# Patient Record
Sex: Female | Born: 1995 | Hispanic: Yes | Marital: Single | Smoking: Never smoker
Health system: Southern US, Community
[De-identification: ages and names within clinical notes are randomized; demographics above are authoritative.]

## PROBLEM LIST (undated history)

## (undated) DIAGNOSIS — N159 Renal tubulo-interstitial disease, unspecified: Secondary | ICD-10-CM

## (undated) DIAGNOSIS — E079 Disorder of thyroid, unspecified: Secondary | ICD-10-CM

## (undated) DIAGNOSIS — T1490XA Injury, unspecified, initial encounter: Secondary | ICD-10-CM

## (undated) HISTORY — PX: OTHER SURGICAL HISTORY: SHX169

## (undated) HISTORY — DX: Disorder of thyroid, unspecified: E07.9

## (undated) HISTORY — DX: Injury, unspecified, initial encounter: T14.90XA

---
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
## 2016-01-21 ENCOUNTER — Emergency Department: Payer: Medicaid Other

## 2016-01-21 ENCOUNTER — Emergency Department
Admission: EM | Admit: 2016-01-21 | Discharge: 2016-01-21 | Disposition: A | Discharge status: Home / Self Care | Payer: Medicaid Other | Attending: Emergency Medicine | Admitting: Emergency Medicine

## 2016-01-21 ENCOUNTER — Encounter: Payer: Self-pay | Admitting: Emergency Medicine

## 2016-01-21 DIAGNOSIS — O2341 Unspecified infection of urinary tract in pregnancy, first trimester: Secondary | ICD-10-CM | POA: Insufficient documentation

## 2016-01-21 DIAGNOSIS — R1030 Lower abdominal pain, unspecified: Secondary | ICD-10-CM | POA: Diagnosis present

## 2016-01-21 DIAGNOSIS — Z3A01 Less than 8 weeks gestation of pregnancy: Secondary | ICD-10-CM | POA: Diagnosis not present

## 2016-01-21 DIAGNOSIS — N390 Urinary tract infection, site not specified: Secondary | ICD-10-CM

## 2016-01-21 DIAGNOSIS — Z3491 Encounter for supervision of normal pregnancy, unspecified, first trimester: Secondary | ICD-10-CM

## 2016-01-21 LAB — URINALYSIS COMPLETE WITH MICROSCOPIC (ARMC ONLY)
BILIRUBIN URINE: NEGATIVE
GLUCOSE, UA: NEGATIVE mg/dL
NITRITE: NEGATIVE
PH: 6 (ref 5.0–8.0)
Protein, ur: 30 mg/dL — AB
SPECIFIC GRAVITY, URINE: 1.018 (ref 1.005–1.030)

## 2016-01-21 LAB — POCT PREGNANCY, URINE: Preg Test, Ur: POSITIVE — AB

## 2016-01-21 LAB — COMPREHENSIVE METABOLIC PANEL
ALT: 9 U/L — ABNORMAL LOW (ref 14–54)
ANION GAP: 12 (ref 5–15)
AST: 14 U/L — ABNORMAL LOW (ref 15–41)
Albumin: 4.7 g/dL (ref 3.5–5.0)
Alkaline Phosphatase: 55 U/L (ref 38–126)
BILIRUBIN TOTAL: 1.1 mg/dL (ref 0.3–1.2)
BUN: 12 mg/dL (ref 6–20)
CHLORIDE: 101 mmol/L (ref 101–111)
CO2: 20 mmol/L — ABNORMAL LOW (ref 22–32)
Calcium: 9.3 mg/dL (ref 8.9–10.3)
Creatinine, Ser: 0.7 mg/dL (ref 0.44–1.00)
Glucose, Bld: 98 mg/dL (ref 65–99)
POTASSIUM: 3.7 mmol/L (ref 3.5–5.1)
Sodium: 133 mmol/L — ABNORMAL LOW (ref 135–145)
TOTAL PROTEIN: 8.1 g/dL (ref 6.5–8.1)

## 2016-01-21 LAB — CBC
HEMATOCRIT: 40.7 % (ref 35.0–47.0)
Hemoglobin: 14.1 g/dL (ref 12.0–16.0)
MCH: 30 pg (ref 26.0–34.0)
MCHC: 34.7 g/dL (ref 32.0–36.0)
MCV: 86.6 fL (ref 80.0–100.0)
Platelets: 218 10*3/uL (ref 150–440)
RBC: 4.7 MIL/uL (ref 3.80–5.20)
RDW: 13.6 % (ref 11.5–14.5)
WBC: 16.9 10*3/uL — AB (ref 3.6–11.0)

## 2016-01-21 LAB — HCG, QUANTITATIVE, PREGNANCY: hCG, Beta Chain, Quant, S: 1781 m[IU]/mL — ABNORMAL HIGH (ref <5)

## 2016-01-21 LAB — LIPASE, BLOOD: LIPASE: 15 U/L (ref 11–51)

## 2016-01-21 MED ORDER — ACETAMINOPHEN 500 MG PO TABS
1000.0000 mg | ORAL_TABLET | ORAL | Status: AC
  Administered 2016-01-21: 1000 mg via ORAL
  Filled 2016-01-21: qty 2

## 2016-01-21 MED ORDER — PROMETHAZINE HCL 25 MG PO TABS
12.5000 mg | ORAL_TABLET | Freq: Once | ORAL | Status: AC
  Administered 2016-01-21: 12.5 mg via ORAL

## 2016-01-21 MED ORDER — PROMETHAZINE HCL 25 MG PO TABS
ORAL_TABLET | ORAL | Status: AC
  Administered 2016-01-21: 12.5 mg via ORAL
  Filled 2016-01-21: qty 1

## 2016-01-21 MED ORDER — DEXTROSE 5 % IV SOLN
1.0000 g | Freq: Once | INTRAVENOUS | Status: AC
  Administered 2016-01-21: 1 g via INTRAVENOUS
  Filled 2016-01-21: qty 10

## 2016-01-21 MED ORDER — SODIUM CHLORIDE 0.9 % IV BOLUS (SEPSIS)
1000.0000 mL | Freq: Once | INTRAVENOUS | Status: AC
  Administered 2016-01-21: 1000 mL via INTRAVENOUS

## 2016-01-21 MED ORDER — CEPHALEXIN 500 MG PO CAPS: 500.0000 mg | ORAL_CAPSULE | Freq: Four times a day (QID) | ORAL | 0 refills | Status: DC

## 2016-01-21 NOTE — ED Provider Notes (Signed)
[facility] Emergency Department Provider Note   ____________________________________________   First MD Initiated Contact with Patient 01/21/16 1920     (approximate)  I have reviewed the triage vital signs and the nursing notes.   HISTORY  Chief Complaint Abdominal Pain and Back Pain    HPI Erin Valentine is a 20 y.o. female reports a previous history of urinary tract infection.  She reports she had a UTI about 5 months ago. Now she suspects she may be pregnant and be having urinary tract infection for about the last week. Last menstrual period was around August 5, though not quite certain.  The last week she has experienced a slight burning feeling with urination and mild discomfort over the left lower back and lower abdomen. She reports she thinks she may have a "UTI. She's been nauseated in the mornings, which led her to check a pregnancy test yesterday which was positive. She denies any vaginal bleeding or discharge. No vomiting, mild nausea at times but none now.  Reports she has never been previously pregnant.  Patient does tell me that the end of last year she had a severe episode of a urinary tract infection, reports at that time symptoms seem much worse than today.  Past Medical History:  Diagnosis Date  . Kidney infection     Patient Active Problem List   Diagnosis Date Noted  . Severe sepsis (HCC) 03/08/2015  . Pyelonephritis 03/06/2015    History reviewed. No pertinent surgical history.  Prior to Admission medications   Medication Sig Start Date End Date Taking? Authorizing Provider  cephALEXin (KEFLEX) 500 MG capsule Take 1 capsule (500 mg total) by mouth 4 (four) times daily. 01/21/16   Sharyn Creamer, MD  Chlorphen-Pseudoephed-APAP [facility] FLU/COLD PO) Take 1 Package by mouth as needed.    Historical Provider, MD  clotrimazole (LOTRIMIN) 1 % cream Apply 1 application topically 2 (two) times daily. Apply to affected skin  01/07/15   Roxy Horseman, PA-C  HYDROcodone-acetaminophen (NORCO) 5-325 MG tablet Take 1 tablet by mouth every 6 (six) hours as needed for moderate pain. 06/06/15   Irean Hong, MD  ibuprofen (ADVIL,MOTRIN) 600 MG tablet Take 1 tablet (600 mg total) by mouth every 8 (eight) hours as needed. 06/06/15   Irean Hong, MD  levofloxacin (LEVAQUIN) 750 MG tablet Take 1 tablet (750 mg total) by mouth daily. 03/10/15   Gwynn Burly, DO  ondansetron (ZOFRAN ODT) 4 MG disintegrating tablet Take 1 tablet (4 mg total) by mouth every 8 (eight) hours as needed for nausea or vomiting. 06/06/15   Irean Hong, MD  phenazopyridine (PYRIDIUM) 100 MG tablet Take 1 tablet (100 mg total) by mouth 3 (three) times daily as needed for pain. 10/08/15   Jeanmarie Plant, MD  sulfamethoxazole-trimethoprim (BACTRIM DS,SEPTRA DS) 800-160 MG tablet Take 2 tablets by mouth 2 (two) times daily. 06/06/15   Irean Hong, MD    Allergies Review of patient's allergies indicates no known allergies.  History reviewed. No pertinent family history.  Social History Social History  Substance Use Topics  . Smoking status: Never Smoker  . Smokeless tobacco: Not on file  . Alcohol use No    Review of Systems Constitutional: No feverFeeling chilled at times. Does not feel weak or dehydrated Eyes: No visual changes. ENT: No sore throat. Cardiovascular: Denies chest pain. Respiratory: Denies shortness of breath. Gastrointestinal: Mild discomfort in the left lower back and over the "bladder". No nausea, no vomiting.  No diarrhea.  No constipation. Genitourinary: Negative for dysuria. Musculoskeletal: Negative for back pain. Skin: Negative for rash. Neurological: Negative for headaches, focal weakness or numbness.  Should be noted that the patient speaks [place], [place], our Spanish interpreter was utilized throughout history taking and examination.  10-point ROS otherwise  negative.  ____________________________________________   PHYSICAL EXAM:  VITAL SIGNS: ED Triage Vitals  Enc Vitals Group     BP 01/21/16 1705 102/67     Pulse Rate 01/21/16 1705 (!) 55     Resp 01/21/16 1705 16     Temp 01/21/16 1705 98.6 F (37 C)     Temp Source 01/21/16 1705 Oral     SpO2 01/21/16 1705 100 %     Weight 01/21/16 1704 130 lb (59 kg)     Height --      Head Circumference --      Peak Flow --      Pain Score 01/21/16 1704 8     Pain Loc --      Pain Edu? --      Excl. in GC? --     Constitutional: Alert and oriented. Well appearing and in no acute distress. Eyes: Conjunctivae are normal. PERRL. EOMI. Head: Atraumatic. Nose: No congestion/rhinnorhea. Mouth/Throat: Mucous membranes are moist.  Oropharynx non-erythematous. Neck: No stridor.   Cardiovascular: Normal rate, regular rhythm. Grossly normal heart sounds.  Good peripheral circulation. Respiratory: Normal respiratory effort.  No retractions. Lungs CTAB. Gastrointestinal: Soft and nontender. No distention. Mild suprapubic discomfort. No abdominal bruits. No CVA tenderness. No obvious gravid abdomen. Musculoskeletal: No lower extremity tenderness nor edema.  Neurologic:  Normal speech and language. No gross focal neurologic deficits are appreciated. Skin:  Skin is warm, dry and intact. No rash noted. Psychiatric: Mood and affect are normal. Speech and behavior are normal.  ____________________________________________   LABS (all labs ordered are listed, but only abnormal results are displayed)  Labs Reviewed  COMPREHENSIVE METABOLIC PANEL - Abnormal; Notable for the following:       Result Value   Sodium 133 (*)    CO2 20 (*)    AST 14 (*)    ALT 9 (*)    All other components within normal limits  CBC - Abnormal; Notable for the following:    WBC 16.9 (*)    All other components within normal limits  URINALYSIS COMPLETEWITH MICROSCOPIC ([facility] ONLY) - Abnormal; Notable for the following:     Color, Urine YELLOW (*)    APPearance CLEAR (*)    Ketones, ur TRACE (*)    Hgb urine dipstick 2+ (*)    Protein, ur 30 (*)    Leukocytes, UA 1+ (*)    Bacteria, UA RARE (*)    Squamous Epithelial / LPF 0-5 (*)    All other components within normal limits  HCG, QUANTITATIVE, PREGNANCY - Abnormal; Notable for the following:    hCG, Beta Chain, Quant, S 1,781 (*)    All other components within normal limits  POCT PREGNANCY, URINE - Abnormal; Notable for the following:    Preg Test, Ur POSITIVE (*)    All other components within normal limits  URINE CULTURE  LIPASE, BLOOD  POC URINE PREG, ED   ____________________________________________  EKG   ____________________________________________  RADIOLOGY  [place] Ob Comp Less 14 Wks  Result Date: 01/21/2016 CLINICAL DATA:  Pregnant patient in first-trimester pregnancy with bilateral flank pain for 6 hours. Lower abdominal pain onset yesterday. Beta HCG 1,781. EXAM: OBSTETRIC <14 WK  [place] AND TRANSVAGINAL OB US TECHNIQUE: Both transabdominal and transvaginal ultrasound examinations were performed for complete evaluation of the gestation as well as the maternal uterus, adnexal regions, and pelvic cul-de-sac. Transvaginal technique was performed to assess early pregnancy. COMPARISON:  None. FINDINGS: Intrauterine gestational sac: Single Yolk sac:  Not visualized. Embryo:  Not visualized. Cardiac Activity: Not visualized. MSD: 3  mm   5 w   0  d Subchorionic hemorrhage: Small, crescentic in morphology measuring 2.2 x 0.2 x 0.8 cm. Maternal uterus/adnexae: The right ovary is normal in size and contains a 1.8 cm corpus luteal cyst. The left ovary is normal in size, there is a 1.6 cm para ovarian cyst. Small volume simple free fluid in the cul-de-sac. IMPRESSION: Probable early intrauterine gestational sac, but no yolk sac, fetal pole, or cardiac activity yet visualized. Recommend follow-up quantitative B-HCG levels and follow-up US in 14 days to confirm and  assess viability. This recommendation follows SRU consensus guidelines: Diagnostic Criteria for Nonviable Pregnancy Early in the First Trimester. Malva Limes Med 2013; 098:1191-47. Electronically Signed   By: Rubye Oaks M.D.   On: 01/21/2016 22:31   US Ob Transvaginal  Result Date: 01/21/2016 CLINICAL DATA:  Pregnant patient in first-trimester pregnancy with bilateral flank pain for 6 hours. Lower abdominal pain onset yesterday. Beta HCG 1,781. EXAM: OBSTETRIC <14 WK [place] AND TRANSVAGINAL OB US TECHNIQUE: Both transabdominal and transvaginal ultrasound examinations were performed for complete evaluation of the gestation as well as the maternal uterus, adnexal regions, and pelvic cul-de-sac. Transvaginal technique was performed to assess early pregnancy. COMPARISON:  None. FINDINGS: Intrauterine gestational sac: Single Yolk sac:  Not visualized. Embryo:  Not visualized. Cardiac Activity: Not visualized. MSD: 3  mm   5 w   0  d Subchorionic hemorrhage: Small, crescentic in morphology measuring 2.2 x 0.2 x 0.8 cm. Maternal uterus/adnexae: The right ovary is normal in size and contains a 1.8 cm corpus luteal cyst. The left ovary is normal in size, there is a 1.6 cm para ovarian cyst. Small volume simple free fluid in the cul-de-sac. IMPRESSION: Probable early intrauterine gestational sac, but no yolk sac, fetal pole, or cardiac activity yet visualized. Recommend follow-up quantitative B-HCG levels and follow-up US in 14 days to confirm and assess viability. This recommendation follows SRU consensus guidelines: Diagnostic Criteria for Nonviable Pregnancy Early in the First Trimester. Malva Limes Med 2013; 829:5621-30. Electronically Signed   By: Rubye Oaks M.D.   On: 01/21/2016 22:31   US Renal  Result Date: 01/21/2016 CLINICAL DATA:  Initial evaluation for acute bilateral flank pain for 6 hours. EXAM: RENAL / URINARY TRACT ULTRASOUND COMPLETE COMPARISON:  Prior CT from 06/06/2015. FINDINGS: Right Kidney:  Length: 10.4 cm. Echogenicity within normal limits. No mass or hydronephrosis visualized. Left Kidney: Length: 11.4 cm. Echogenicity within normal limits. No mass or hydronephrosis visualized. Bladder: Appears normal for degree of bladder distention. IMPRESSION: Normal renal ultrasound.  No acute abnormality identified. Electronically Signed   By: Rise Mu M.D.   On: 01/21/2016 21:31    ____________________________________________   PROCEDURES  Procedure(s) performed: None  Procedures  Critical Care performed: No  ____________________________________________   INITIAL IMPRESSION / ASSESSMENT AND PLAN / ED COURSE  Pertinent labs & imaging results that were available during my care of the patient were reviewed by me and considered in my medical decision making (see chart for details).  Patient presents today these may be pregnant, also suspecting a possible UTI. Clinical history of dysuria  associated with mild suprapubic tenderness as well as report of mild left flank pain appears consistent with probable urinary tract infection, and also positive pregnancy test. Given she does report abdominal pain has never had ultrasound confirming an intrauterine pregnancy we will proceed with evaluation to exclude ectopic pregnancy as well, though clinically I suspect most likely suffering from urinary tract infection.  ----------------------------------------- 10:45 PM on 01/21/2016 -----------------------------------------  No findings to support an ectopic pregnancy. Patient is very early, was relatively low hCG and I suspect the patient is likely suffering from urinary tract infection. No signs or symptoms of sepsis. Mild leukocytosis, may be secondary to pregnancy though most likely due to infection. Discussed with the patient, and she is resting comfortably and feeling well at this time. She will be following up closely, and I have provided her with careful and strict return  precautions. Plan will be to follow-up with Dr. Ranae Plumber, on the inside obstetrician. Patient will be calling Monday to set up. This position, discharge instructions, an antibiotic provided and discussed with the patient via interpreter.  Clinical Course     ____________________________________________   FINAL CLINICAL IMPRESSION(S) / ED DIAGNOSES  Final diagnoses:  UTI (lower urinary tract infection)  First trimester pregnancy      NEW MEDICATIONS STARTED DURING THIS VISIT:  New Prescriptions   CEPHALEXIN (KEFLEX) 500 MG CAPSULE    Take 1 capsule (500 mg total) by mouth 4 (four) times daily.     Note:  This document was prepared using Dragon voice recognition software and may include unintentional dictation errors.     Sharyn Creamer, MD 01/21/16 [phone]

## 2016-01-21 NOTE — ED Notes (Signed)
Pt returned from US

## 2016-01-21 NOTE — ED Triage Notes (Addendum)
Pt c/o lower back pain and mid abdominal pain. Not sure if pregnant. Has also been nauseated. No vaginal bleeding

## 2016-01-21 NOTE — Discharge Instructions (Signed)
Please follow up closely for repeat blood testing and an ultrasound of your baby within 2 weeks with obstetrics and gynecology or your primary doctor.  Return to the emergency room if you bleeding, pain worsens, you become weak and dizzy or lightheaded, you have vomiting or weakness, or fever, you have an episode of passing out, develop severe bleeding such as more than 1 soaked pad per hour for more than 3 straight hours, develop abdominal or pelvic pain, fevers chills or other new concerns arise.

## 2016-01-24 LAB — URINE CULTURE: Culture: >=100000 — AB

## 2016-03-02 ENCOUNTER — Emergency Department (HOSPITAL_COMMUNITY)
Admission: EM | Admit: 2016-03-02 | Discharge: 2016-03-02 | Disposition: A | Discharge status: Home / Self Care | Payer: Medicaid Other | Attending: Emergency Medicine | Admitting: Emergency Medicine

## 2016-03-02 ENCOUNTER — Emergency Department (HOSPITAL_COMMUNITY): Payer: Medicaid Other

## 2016-03-02 ENCOUNTER — Encounter (HOSPITAL_COMMUNITY): Payer: Self-pay

## 2016-03-02 DIAGNOSIS — O26899 Other specified pregnancy related conditions, unspecified trimester: Secondary | ICD-10-CM

## 2016-03-02 DIAGNOSIS — Z3A10 10 weeks gestation of pregnancy: Secondary | ICD-10-CM | POA: Insufficient documentation

## 2016-03-02 DIAGNOSIS — O2341 Unspecified infection of urinary tract in pregnancy, first trimester: Secondary | ICD-10-CM | POA: Diagnosis not present

## 2016-03-02 DIAGNOSIS — R102 Pelvic and perineal pain: Secondary | ICD-10-CM

## 2016-03-02 DIAGNOSIS — O2340 Unspecified infection of urinary tract in pregnancy, unspecified trimester: Secondary | ICD-10-CM

## 2016-03-02 DIAGNOSIS — O0281 Inappropriate change in quantitative human chorionic gonadotropin (hCG) in early pregnancy: Secondary | ICD-10-CM | POA: Diagnosis not present

## 2016-03-02 DIAGNOSIS — O26891 Other specified pregnancy related conditions, first trimester: Secondary | ICD-10-CM | POA: Diagnosis present

## 2016-03-02 LAB — WET PREP, GENITAL
CLUE CELLS WET PREP: NONE SEEN
SPERM: NONE SEEN
TRICH WET PREP: NONE SEEN
Yeast Wet Prep HPF POC: NONE SEEN

## 2016-03-02 LAB — URINE MICROSCOPIC-ADD ON

## 2016-03-02 LAB — URINALYSIS, ROUTINE W REFLEX MICROSCOPIC
Bilirubin Urine: NEGATIVE
Glucose, UA: NEGATIVE mg/dL
Hgb urine dipstick: NEGATIVE
Ketones, ur: NEGATIVE mg/dL
NITRITE: NEGATIVE
Protein, ur: NEGATIVE mg/dL
SPECIFIC GRAVITY, URINE: 1.019 (ref 1.005–1.030)
pH: 6.5 (ref 5.0–8.0)

## 2016-03-02 LAB — RAPID HIV SCREEN (HIV 1/2 AB+AG)
HIV 1/2 Antibodies: NONREACTIVE
HIV-1 P24 Antigen - HIV24: NONREACTIVE

## 2016-03-02 LAB — HCG, QUANTITATIVE, PREGNANCY: hCG, Beta Chain, Quant, S: 125404 m[IU]/mL — ABNORMAL HIGH (ref <5)

## 2016-03-02 MED ORDER — ONDANSETRON 4 MG PO TBDP
4.0000 mg | ORAL_TABLET | Freq: Once | ORAL | Status: AC
Start: 2016-03-02 — End: 2016-03-02
  Administered 2016-03-02: 4 mg via ORAL
  Filled 2016-03-02: qty 1

## 2016-03-02 MED ORDER — DEXTROSE 5 % IV SOLN: 1.0000 g | Freq: Once | INTRAVENOUS | Status: DC

## 2016-03-02 MED ORDER — CEFTRIAXONE SODIUM 250 MG IJ SOLR
250.0000 mg | Freq: Once | INTRAMUSCULAR | Status: AC
Start: 2016-03-02 — End: 2016-03-02
  Administered 2016-03-02: 250 mg via INTRAMUSCULAR
  Filled 2016-03-02: qty 250

## 2016-03-02 MED ORDER — DOXYLAMINE-PYRIDOXINE 10-10 MG PO TBEC: 1.0000 | DELAYED_RELEASE_TABLET | Freq: Three times a day (TID) | ORAL | 0 refills | Status: DC | PRN

## 2016-03-02 MED ORDER — AZITHROMYCIN 250 MG PO TABS
1000.0000 mg | ORAL_TABLET | Freq: Once | ORAL | Status: AC
Start: 2016-03-02 — End: 2016-03-02
  Administered 2016-03-02: 1000 mg via ORAL
  Filled 2016-03-02: qty 4

## 2016-03-02 MED ORDER — CEPHALEXIN 500 MG PO CAPS: 500.0000 mg | ORAL_CAPSULE | Freq: Three times a day (TID) | ORAL | 0 refills | Status: DC

## 2016-03-02 NOTE — ED Notes (Signed)
Patient Alert and oriented X4. Stable and ambulatory. Patient verbalized understanding of the discharge instructions.  Patient belongings were taken by the patient.  

## 2016-03-02 NOTE — ED Provider Notes (Signed)
[facility]-EMERGENCY DEPT Provider Note   CSN: [id number] Arrival date & time: 03/02/16  7829     History   Chief Complaint Chief Complaint  Patient presents with  . Back Pain    HPI Erin Valentine is a 20 y.o. female.  HPI    20 year old female G1P0 Presents complaining of low back pain. Patient reports yesterday she developed some pain to her low abdomen radiates towards her back. Pain and now reside primary to the right low back. She described pain as a stabbing sensation, waxing waning, with burning urination and urinary frequency. She took some Tylenol which has helped. Pain is mild to moderate at this time. She did report having some nausea and occasional nonbloody non-bilious vomit yesterday and felt nauseous currently. She denies fever, chills, lightheadedness, dizziness, vaginal itching, vaginal discharge, vaginal bleeding, or rash. She has not had a formal ultrasound and have not follow-up with OB/GYN yet. She does have an appointment today but missed it. She is currently on prenatal vitamin. No history of kidney stone.    Past Medical History:  Diagnosis Date  . Kidney infection     Patient Active Problem List   Diagnosis Date Noted  . Severe sepsis (HCC) 03/08/2015  . Pyelonephritis 03/06/2015    History reviewed. No pertinent surgical history.  OB History    Gravida Para Term Preterm AB Living   1             SAB TAB Ectopic Multiple Live Births                   Home Medications    Prior to Admission medications   Medication Sig Start Date End Date Taking? Authorizing Provider  cephALEXin (KEFLEX) 500 MG capsule Take 1 capsule (500 mg total) by mouth 4 (four) times daily. 01/21/16   Sharyn Creamer, MD  Chlorphen-Pseudoephed-APAP [facility] FLU/COLD PO) Take 1 Package by mouth as needed.    Historical Provider, MD  clotrimazole (LOTRIMIN) 1 % cream Apply 1 application topically 2 (two) times daily. Apply to affected skin 01/07/15   Roxy Horseman, PA-C    HYDROcodone-acetaminophen (NORCO) 5-325 MG tablet Take 1 tablet by mouth every 6 (six) hours as needed for moderate pain. 06/06/15   Irean Hong, MD  ibuprofen (ADVIL,MOTRIN) 600 MG tablet Take 1 tablet (600 mg total) by mouth every 8 (eight) hours as needed. 06/06/15   Irean Hong, MD  levofloxacin (LEVAQUIN) 750 MG tablet Take 1 tablet (750 mg total) by mouth daily. 03/10/15   Gwynn Burly, DO  ondansetron (ZOFRAN ODT) 4 MG disintegrating tablet Take 1 tablet (4 mg total) by mouth every 8 (eight) hours as needed for nausea or vomiting. 06/06/15   Irean Hong, MD  phenazopyridine (PYRIDIUM) 100 MG tablet Take 1 tablet (100 mg total) by mouth 3 (three) times daily as needed for pain. 10/08/15   Jeanmarie Plant, MD  sulfamethoxazole-trimethoprim (BACTRIM DS,SEPTRA DS) 800-160 MG tablet Take 2 tablets by mouth 2 (two) times daily. 06/06/15   Irean Hong, MD    Family History History reviewed. No pertinent family history.  Social History Social History  Substance Use Topics  . Smoking status: Never Smoker  . Smokeless tobacco: Never Used  . Alcohol use No     Allergies   Review of patient's allergies indicates no known allergies.   Review of Systems Review of Systems  All other systems reviewed and are negative.    Physical Exam Updated  Vital Signs BP 105/70 (BP Location: Left Arm)   Pulse 70   Temp 97.7 F (36.5 C) (Oral)   Resp 16   Ht 5\' 5"  (1.651 m)   Wt 59 kg   LMP 12/19/2015 (Approximate)   SpO2 94%   BMI 21.63 kg/m   Physical Exam  Constitutional: She appears well-developed and well-nourished. No distress.  HENT:  Head: Atraumatic.  Eyes: Conjunctivae are normal.  Neck: Neck supple.  Cardiovascular: Normal rate and regular rhythm.   Pulmonary/Chest: Effort normal and breath sounds normal.  Abdominal: Soft. She exhibits no distension. There is tenderness (Mild suprapubic tenderness on palpation without guarding or rebound tenderness.).  Genitourinary:   Genitourinary Comments: Chaperone present during exam. No inguinal lymphadenopathy or inguinal hernia noted. Normal external genitalia. No pain with speculum insertion. Moderate yellow vaginal discharge noted in vaginal vault. Visualized close cervical os. On bimanual examination no adnexal tenderness or cervical motion tenderness.  Musculoskeletal: She exhibits tenderness (Mild tenderness noted to right lumbar paraspinal muscle on palpation without any significant midline spine tenderness.).  Neurological: She is alert.  Skin: No rash noted.  Psychiatric: She has a normal mood and affect.  Nursing note and vitals reviewed.    ED Treatments / Results  Labs (all labs ordered are listed, but only abnormal results are displayed) Labs Reviewed  WET PREP, GENITAL - Abnormal; Notable for the following:       Result Value   WBC, Wet Prep HPF POC MANY (*)    All other components within normal limits  URINALYSIS, ROUTINE W REFLEX MICROSCOPIC (NOT AT [facility]) - Abnormal; Notable for the following:    APPearance CLOUDY (*)    Leukocytes, UA LARGE (*)    All other components within normal limits  URINE MICROSCOPIC-ADD ON - Abnormal; Notable for the following:    Squamous Epithelial / LPF 6-30 (*)    Bacteria, UA MANY (*)    All other components within normal limits  HCG, QUANTITATIVE, PREGNANCY - Abnormal; Notable for the following:    hCG, Beta Chain, Quant, S 125,404 (*)    All other components within normal limits  URINE CULTURE  RAPID HIV SCREEN (HIV 1/2 AB+AG)  RPR  GC/CHLAMYDIA PROBE AMP ([place]) NOT AT [facility]    EKG  EKG Interpretation None       Radiology US Ob Comp Less 14 Wks  Result Date: 03/02/2016 CLINICAL DATA:  Right-sided kidney pain. EXAM: OBSTETRIC <14 WK [place] AND TRANSVAGINAL OB US TECHNIQUE: Both transabdominal and transvaginal ultrasound examinations were performed for complete evaluation of the gestation as well as the maternal uterus, adnexal regions, and  pelvic cul-de-sac. Transvaginal technique was performed to assess early pregnancy. COMPARISON:  01/21/2016. FINDINGS: Intrauterine gestational sac: Single Yolk sac:  Present Embryo:  Present Cardiac Activity: Present Heart Rate: 147  bpm CRL:  2.95 cm 10 w   6 d                  [place] EDC: 09/22/2016 Subchorionic hemorrhage:  Small. Maternal uterus/adnexae: 1.7 cm right hypoechoic region possibly residual corpus luteal cyst. Trace amount of free pelvic fluid . IMPRESSION: Single viable intrauterine pregnancy at 10 weeks 6 days. Small subchorionic hemorrhage noted. Trace free pelvic fluid. Follow-up exam can be obtained to demonstrate resolution of the subchorionic hemorrhage. Electronically Signed   By: Maisie Fus  Register   On: 03/02/2016 15:00   US Ob Transvaginal  Result Date: 03/02/2016 CLINICAL DATA:  Right-sided kidney pain. EXAM: OBSTETRIC <14 WK [place]  AND TRANSVAGINAL OB US TECHNIQUE: Both transabdominal and transvaginal ultrasound examinations were performed for complete evaluation of the gestation as well as the maternal uterus, adnexal regions, and pelvic cul-de-sac. Transvaginal technique was performed to assess early pregnancy. COMPARISON:  01/21/2016. FINDINGS: Intrauterine gestational sac: Single Yolk sac:  Present Embryo:  Present Cardiac Activity: Present Heart Rate: 147  bpm CRL:  2.95 cm 10 w   6 d                  US EDC: 09/22/2016 Subchorionic hemorrhage:  Small. Maternal uterus/adnexae: 1.7 cm right hypoechoic region possibly residual corpus luteal cyst. Trace amount of free pelvic fluid . IMPRESSION: Single viable intrauterine pregnancy at 10 weeks 6 days. Small subchorionic hemorrhage noted. Trace free pelvic fluid. Follow-up exam can be obtained to demonstrate resolution of the subchorionic hemorrhage. Electronically Signed   By: Maisie Fushomas  Register   On: 03/02/2016 15:00    Procedures Procedures (including critical care time)  Medications Ordered in ED Medications  azithromycin  (ZITHROMAX) tablet 1,000 mg (1,000 mg Oral Given 03/02/16 1202)  cefTRIAXone (ROCEPHIN) injection 250 mg (250 mg Intramuscular Given 03/02/16 1202)  ondansetron (ZOFRAN-ODT) disintegrating tablet 4 mg (4 mg Oral Given 03/02/16 1448)     Initial Impression / Assessment and Plan / ED Course  I have reviewed the triage vital signs and the nursing notes.  Pertinent labs & imaging results that were available during my care of the patient were reviewed by me and considered in my medical decision making (see chart for details).  Clinical Course    BP 97/56 (BP Location: Left Arm)   Pulse 69   Temp 97.7 F (36.5 C) (Oral)   Resp 18   Ht 5\' 5"  (1.651 m)   Wt 59 kg   LMP 12/19/2015 (Approximate)   SpO2 100%   BMI 21.63 kg/m    Final Clinical Impressions(s) / ED Diagnoses   Final diagnoses:  Urinary tract infection in mother during pregnancy, antepartum    New Prescriptions New Prescriptions   CEPHALEXIN (KEFLEX) 500 MG CAPSULE    Take 1 capsule (500 mg total) by mouth 3 (three) times daily.   DOXYLAMINE-PYRIDOXINE 10-10 MG TBEC    Take 1 tablet by mouth every 8 (eight) hours as needed (nausea).   9:57 AM Patient here with low abdominal pain and low back pain with urinary discomfort. Symptoms suggestive of urinary tract infection. She is currently [redacted] weeks pregnant, last menstrual period 08/13. She has not had a formal ultrasound to confirm IUP.  3:12 PM I performed bedside US which shows IUP.  I did obtain a formal US and it confirmed single living IUP 10 weeks and 6 days.  Pt does have evidence of UTI, will treat with keflex.  Rocephin/zithromax given in the ER to cover for potential STD.  Pt was found to be hypotensive in the ER but asymptomatic. This may be her baseline due to her small body habitus.  Doubt sepsis. She is well appearing, family member is at bedside. After discussion, Pt to f/u with OB for further care.  Strict return precaution discussed including fever, worsening  pain, lightheadedness/dizziness.     Fayrene HelperBowie Anmarie Fukushima, PA-C 03/02/16 1521    Alvira MondayErin Schlossman, MD 03/02/16 2350

## 2016-03-02 NOTE — ED Triage Notes (Signed)
Pt reports she has back pain. She has hx of kidney infection. Pt also reports she is [redacted] weeks pregnant.

## 2016-03-02 NOTE — ED Notes (Signed)
Patient transported to Ultrasound 

## 2016-03-02 NOTE — ED Notes (Signed)
Called US..will be a while before coming to get patient.

## 2016-03-02 NOTE — ED Notes (Signed)
Patient having soft blood pressure. Pt calm, comfortable, A/O x 4.  EDP notified.

## 2016-03-02 NOTE — Discharge Instructions (Signed)
You are 10 weeks and 6 days pregnant.  Followup with OBGYN for further management of your pregnancy.  You have a urinary tract infection.  Take antibiotic as prescribed for the full duration.

## 2016-03-02 NOTE — ED Notes (Signed)
Pt states that she threw up in trash can.  Unable to assess.  Notified EDP of Nausea/Vomiting.

## 2016-03-03 LAB — GC/CHLAMYDIA PROBE AMP (~~LOC~~) NOT AT ARMC
Chlamydia: NEGATIVE
Neisseria Gonorrhea: NEGATIVE

## 2016-03-03 LAB — RPR: RPR: NONREACTIVE

## 2016-03-04 LAB — URINE CULTURE: Culture: >=100000 — AB

## 2016-03-05 ENCOUNTER — Telehealth (HOSPITAL_BASED_OUTPATIENT_CLINIC_OR_DEPARTMENT_OTHER): Payer: Self-pay | Admitting: Emergency Medicine

## 2016-03-05 NOTE — Telephone Encounter (Signed)
Post ED Visit - Positive Culture Follow-up  Culture report reviewed by antimicrobial stewardship pharmacist:  []  Enzo BiNathan Batchelder, Pharm.D. []  Celedonio MiyamotoJeremy Frens, Pharm.D., BCPS []  Garvin FilaMike Maccia, Pharm.D. []  Georgina PillionElizabeth Martin, Pharm.D., BCPS []  AuroraMinh Pham, [place]., BCPS, AAHIVP []  Estella HuskMichelle Turner, Pharm.D., BCPS, AAHIVP []  Tennis Mustassie Stewart, Pharm.D. []  Sherle Poeob Vincent, [place]arm[place]D. Lysle Pearlachel Rumbarger PharmD  Positive urine culture Treated with cephalexin, organism sensitive to the same and no further patient follow-up is required at this time.  Berle MullMiller, Cassey Bacigalupo 03/05/2016, 10:34 AM

## 2016-05-08 NOTE — L&D Delivery Note (Signed)
Obstetrical Delivery Note   Date of Delivery:   09/25/2016 Primary OB:   [facility] Gestational Age/EDD: 3237w1d (Dated by LMP) Antepartum complications: Frequent UTIs  Delivered By:   Farrel Connersolleen Cregg Jutte, CNM  Delivery Type:   spontaneous vaginal delivery  Procedure Details:   CTSP for urge to push. Ant lip/C/+1 to +2. Anterior lip easily reducible when patient pushed. Variable decelerations when pushing responded to placing wedge under back and O2. Patient pushed well delivering a vigorous female baby in LOA. Baby placed on mother's abdomen and dried. After delayed cord clamping, the FOB cut the cord. Placenta delivered spontaneously and intact with 3 vessel cord. Second degree laceration repaired with 2-0 Vicryl and 3-0 Chromic. Right clitoral lac repaired with 3-0 Chromic. Prior to repair, straight cathed for a moderate amt urine to facilitate uterine contraction. Uterine atony resolved with IV Pitocin and bimanual Anesthesia:    local and epidural Intrapartum complications: None GBS:    Positive-adequately treated with PCN Laceration:    Second degree perineal and right clitoral lac Episiotomy:    none Placenta:    Via active 3rd stage. To pathology: no Estimated Blood Loss:  450 ml  Baby:    Liveborn female, Apgars 8/9, weight pending    Farrel Connersolleen Sharah Finnell, CNM

## 2016-09-15 ENCOUNTER — Other Ambulatory Visit: Payer: Self-pay

## 2016-09-18 ENCOUNTER — Ambulatory Visit (INDEPENDENT_AMBULATORY_CARE_PROVIDER_SITE_OTHER): Payer: Medicaid Other

## 2016-09-18 ENCOUNTER — Other Ambulatory Visit: Payer: Self-pay | Admitting: Obstetrics & Gynecology

## 2016-09-18 DIAGNOSIS — N12 Tubulo-interstitial nephritis, not specified as acute or chronic: Secondary | ICD-10-CM

## 2016-09-25 ENCOUNTER — Inpatient Hospital Stay: Payer: Medicaid Other | Admitting: Certified Registered Nurse Anesthetist

## 2016-09-25 ENCOUNTER — Encounter: Payer: Self-pay | Admitting: Obstetrics and Gynecology

## 2016-09-25 ENCOUNTER — Inpatient Hospital Stay
Admission: EM | Admit: 2016-09-25 | Discharge: 2016-09-27 | DRG: 775 | Disposition: A | Discharge status: Home / Self Care | Payer: Medicaid Other | Attending: Obstetrics and Gynecology | Admitting: Obstetrics and Gynecology

## 2016-09-25 DIAGNOSIS — Z3A40 40 weeks gestation of pregnancy: Secondary | ICD-10-CM

## 2016-09-25 DIAGNOSIS — Z87440 Personal history of urinary (tract) infections: Secondary | ICD-10-CM | POA: Diagnosis not present

## 2016-09-25 DIAGNOSIS — O99824 Streptococcus B carrier state complicating childbirth: Secondary | ICD-10-CM | POA: Diagnosis not present

## 2016-09-25 DIAGNOSIS — Z3493 Encounter for supervision of normal pregnancy, unspecified, third trimester: Secondary | ICD-10-CM | POA: Diagnosis present

## 2016-09-25 LAB — TYPE AND SCREEN
ABO/RH(D): A POS
Antibody Screen: NEGATIVE

## 2016-09-25 LAB — CBC
HCT: 40 % (ref 35.0–47.0)
HEMOGLOBIN: 14 g/dL (ref 12.0–16.0)
MCH: 31.6 pg (ref 26.0–34.0)
MCHC: 34.9 g/dL (ref 32.0–36.0)
MCV: 90.7 fL (ref 80.0–100.0)
Platelets: 151 10*3/uL (ref 150–440)
RBC: 4.41 MIL/uL (ref 3.80–5.20)
RDW: 13.3 % (ref 11.5–14.5)
WBC: 11.1 10*3/uL — ABNORMAL HIGH (ref 3.6–11.0)

## 2016-09-25 LAB — CHLAMYDIA/NGC RT PCR (ARMC ONLY)
CHLAMYDIA TR: NOT DETECTED
N gonorrhoeae: NOT DETECTED

## 2016-09-25 MED ORDER — KETOROLAC TROMETHAMINE 30 MG/ML IJ SOLN: 30.0000 mg | Freq: Four times a day (QID) | INTRAMUSCULAR | Status: DC | PRN

## 2016-09-25 MED ORDER — BENZOCAINE-MENTHOL 20-0.5 % EX AERO
1.0000 "application " | INHALATION_SPRAY | CUTANEOUS | Status: DC | PRN
  Administered 2016-09-25: 1 via TOPICAL
  Filled 2016-09-25 (×2): qty 56

## 2016-09-25 MED ORDER — PENICILLIN G POT IN DEXTROSE 60000 UNIT/ML IV SOLN
3.0000 10*6.[IU] | INTRAVENOUS | Status: DC
  Filled 2016-09-25 (×5): qty 50

## 2016-09-25 MED ORDER — OXYTOCIN 10 UNIT/ML IJ SOLN: 10.0000 [IU] | Freq: Once | INTRAMUSCULAR | Status: DC

## 2016-09-25 MED ORDER — AMMONIA AROMATIC IN INHA
RESPIRATORY_TRACT | Status: AC
  Filled 2016-09-25: qty 10

## 2016-09-25 MED ORDER — LIDOCAINE HCL (PF) 1 % IJ SOLN
INTRAMUSCULAR | Status: DC | PRN
  Administered 2016-09-25: 3 mL

## 2016-09-25 MED ORDER — LIDOCAINE-EPINEPHRINE (PF) 1.5 %-1:200000 IJ SOLN
INTRAMUSCULAR | Status: DC | PRN
  Administered 2016-09-25: 3 mL via PERINEURAL

## 2016-09-25 MED ORDER — NALBUPHINE HCL 10 MG/ML IJ SOLN: 5.0000 mg | Freq: Once | INTRAMUSCULAR | Status: DC | PRN

## 2016-09-25 MED ORDER — MEPERIDINE HCL 25 MG/ML IJ SOLN
6.2500 mg | INTRAMUSCULAR | Status: DC | PRN
Start: 2016-09-25 — End: 2016-09-25

## 2016-09-25 MED ORDER — NALBUPHINE HCL 10 MG/ML IJ SOLN: 5.0000 mg | INTRAMUSCULAR | Status: DC | PRN

## 2016-09-25 MED ORDER — NALOXONE HCL 2 MG/2ML IJ SOSY
1.0000 ug/kg/h | PREFILLED_SYRINGE | INTRAVENOUS | Status: DC | PRN
  Filled 2016-09-25: qty 2

## 2016-09-25 MED ORDER — DIPHENHYDRAMINE HCL 25 MG PO CAPS: 25.0000 mg | ORAL_CAPSULE | ORAL | Status: DC | PRN

## 2016-09-25 MED ORDER — FERROUS SULFATE 325 (65 FE) MG PO TABS
325.0000 mg | ORAL_TABLET | Freq: Every day | ORAL | Status: DC
  Administered 2016-09-26 – 2016-09-27 (×2): 325 mg via ORAL
  Filled 2016-09-25 (×2): qty 1

## 2016-09-25 MED ORDER — ONDANSETRON HCL 4 MG PO TABS: 4.0000 mg | ORAL_TABLET | ORAL | Status: DC | PRN

## 2016-09-25 MED ORDER — SODIUM CHLORIDE 0.9% FLUSH: 3.0000 mL | INTRAVENOUS | Status: DC | PRN

## 2016-09-25 MED ORDER — ONDANSETRON HCL 4 MG/2ML IJ SOLN: 4.0000 mg | INTRAMUSCULAR | Status: DC | PRN

## 2016-09-25 MED ORDER — OXYTOCIN 10 UNIT/ML IJ SOLN
INTRAMUSCULAR | Status: AC
  Filled 2016-09-25: qty 2

## 2016-09-25 MED ORDER — FENTANYL CITRATE (PF) 100 MCG/2ML IJ SOLN
50.0000 ug | Freq: Once | INTRAMUSCULAR | Status: AC
  Administered 2016-09-25: 50 ug via INTRAVENOUS
  Filled 2016-09-25: qty 2

## 2016-09-25 MED ORDER — SOD CITRATE-CITRIC ACID 500-334 MG/5ML PO SOLN: 30.0000 mL | ORAL | Status: DC | PRN

## 2016-09-25 MED ORDER — ONDANSETRON HCL 4 MG/2ML IJ SOLN: 4.0000 mg | Freq: Four times a day (QID) | INTRAMUSCULAR | Status: DC | PRN

## 2016-09-25 MED ORDER — BUPIVACAINE HCL (PF) 0.25 % IJ SOLN
INTRAMUSCULAR | Status: DC | PRN
  Administered 2016-09-25 (×2): 4 mL via EPIDURAL

## 2016-09-25 MED ORDER — FENTANYL 2.5 MCG/ML W/ROPIVACAINE 0.2% IN NS 100 ML EPIDURAL INFUSION (ARMC-ANES)
EPIDURAL | Status: AC
  Filled 2016-09-25: qty 100

## 2016-09-25 MED ORDER — OXYTOCIN BOLUS FROM INFUSION
500.0000 mL | Freq: Once | INTRAVENOUS | Status: DC
  Administered 2016-09-25: 500 mL via INTRAVENOUS

## 2016-09-25 MED ORDER — MISOPROSTOL 200 MCG PO TABS
ORAL_TABLET | ORAL | Status: AC
  Filled 2016-09-25: qty 4

## 2016-09-25 MED ORDER — PENICILLIN G POTASSIUM 5000000 UNITS IJ SOLR
5.0000 10*6.[IU] | Freq: Once | INTRAVENOUS | Status: AC
  Administered 2016-09-25: 5 10*6.[IU] via INTRAVENOUS
  Filled 2016-09-25: qty 5

## 2016-09-25 MED ORDER — WITCH HAZEL-GLYCERIN EX PADS: 1.0000 "application " | MEDICATED_PAD | CUTANEOUS | Status: DC | PRN

## 2016-09-25 MED ORDER — IBUPROFEN 600 MG PO TABS
600.0000 mg | ORAL_TABLET | Freq: Four times a day (QID) | ORAL | Status: DC
  Administered 2016-09-25 – 2016-09-27 (×7): 600 mg via ORAL
  Filled 2016-09-25 (×7): qty 1

## 2016-09-25 MED ORDER — SENNOSIDES-DOCUSATE SODIUM 8.6-50 MG PO TABS
2.0000 | ORAL_TABLET | ORAL | Status: DC
  Administered 2016-09-26: 2 via ORAL
  Filled 2016-09-25: qty 2

## 2016-09-25 MED ORDER — DIBUCAINE 1 % RE OINT: 1.0000 "application " | TOPICAL_OINTMENT | RECTAL | Status: DC | PRN

## 2016-09-25 MED ORDER — SODIUM CHLORIDE FLUSH 0.9 % IV SOLN
INTRAVENOUS | Status: AC
Start: 2016-09-25 — End: 2016-09-25
  Administered 2016-09-25: 1 mL
  Filled 2016-09-25: qty 10

## 2016-09-25 MED ORDER — COCONUT OIL OIL: 1.0000 "application " | TOPICAL_OIL | Status: DC | PRN

## 2016-09-25 MED ORDER — DIPHENHYDRAMINE HCL 50 MG/ML IJ SOLN: 12.5000 mg | INTRAMUSCULAR | Status: DC | PRN

## 2016-09-25 MED ORDER — LIDOCAINE HCL (PF) 1 % IJ SOLN: 30.0000 mL | INTRAMUSCULAR | Status: DC | PRN

## 2016-09-25 MED ORDER — LACTATED RINGERS IV SOLN
INTRAVENOUS | Status: DC
  Administered 2016-09-25: 07:00:00 via INTRAVENOUS

## 2016-09-25 MED ORDER — LIDOCAINE HCL (PF) 1 % IJ SOLN
INTRAMUSCULAR | Status: AC
  Filled 2016-09-25: qty 30

## 2016-09-25 MED ORDER — SIMETHICONE 80 MG PO CHEW: 80.0000 mg | CHEWABLE_TABLET | ORAL | Status: DC | PRN

## 2016-09-25 MED ORDER — NALOXONE HCL 0.4 MG/ML IJ SOLN: 0.4000 mg | INTRAMUSCULAR | Status: DC | PRN

## 2016-09-25 MED ORDER — FENTANYL 2.5 MCG/ML W/ROPIVACAINE 0.2% IN NS 100 ML EPIDURAL INFUSION (ARMC-ANES)
EPIDURAL | Status: DC | PRN
  Administered 2016-09-25: 10 mL/h via EPIDURAL

## 2016-09-25 MED ORDER — FENTANYL 2.5 MCG/ML W/ROPIVACAINE 0.2% IN NS 100 ML EPIDURAL INFUSION (ARMC-ANES): 10.0000 mL/h | EPIDURAL | Status: DC

## 2016-09-25 MED ORDER — OXYTOCIN 40 UNITS IN LACTATED RINGERS INFUSION - SIMPLE MED
2.5000 [IU]/h | INTRAVENOUS | Status: DC
  Filled 2016-09-25: qty 1000

## 2016-09-25 MED ORDER — LACTATED RINGERS IV SOLN: 500.0000 mL | INTRAVENOUS | Status: DC | PRN

## 2016-09-25 MED ORDER — PRENATAL MULTIVITAMIN CH
1.0000 | ORAL_TABLET | Freq: Every day | ORAL | Status: DC
  Administered 2016-09-25 – 2016-09-26 (×2): 1 via ORAL
  Filled 2016-09-25 (×2): qty 1

## 2016-09-25 MED ORDER — ONDANSETRON HCL 4 MG/2ML IJ SOLN: 4.0000 mg | Freq: Three times a day (TID) | INTRAMUSCULAR | Status: DC | PRN

## 2016-09-25 NOTE — Progress Notes (Addendum)
Pt arrived via EMS with c/o painful labor contractions and water broke about 0530a this morning, clear fluid, says she was not timing contractions. Confirms +FM, Denies spotting or vaginal bleeding, just noticing mucus discharge. GBS pos. [facility]

## 2016-09-25 NOTE — Progress Notes (Signed)
EFM applied, nitrazine pos. SVE performed, 4.5/80/-2, no membrane felt on exam.

## 2016-09-25 NOTE — Discharge Summary (Signed)
Obstetric Discharge Summary Reason for Admission: onset of labor Prenatal Procedures: ultrasound Intrapartum Procedures: spontaneous vaginal delivery, GBS prophylaxis and epidural and repair of second degree laceration and right clitoral laceration Postpartum Procedures: none Complications-Operative and Postpartum: none Hemoglobin  Date Value Ref Range Status  09/26/2016 12.1 12.0 - 16.0 g/dL Final   HCT  Date Value Ref Range Status  09/26/2016 35.1 35.0 - 47.0 % Final    Physical Exam:  General: alert, cooperative and no distress Lochia: appropriate Uterine Fundus: firm Incision: none DVT Evaluation: No evidence of DVT seen on physical exam. Negative Homan's sign.  Discharge Diagnoses: Term Pregnancy-delivered  Discharge Information: Date: 09/27/2016 Activity: pelvic rest Diet: routine Medications: PNV and Ibuprofen Condition: stable Instructions: see discharge instructions in computer Discharge to: home Follow-up Information    Department, [facility]. Schedule an appointment as soon as possible for a visit.   Why:  Call to make a 6 week postpartum check up Contact information: [address] [place] [phone]           Newborn Data: Live born female  Birth Weight:   7-6 lbs APGAR: 8, 9  Home with mother.  Erin Valentine Erin Valentine 09/27/2016, 9:36 AM

## 2016-09-25 NOTE — Anesthesia Procedure Notes (Signed)
Epidural Patient location during procedure: OB Start time: 09/25/2016 8:50 AM End time: 09/25/2016 9:09 AM  Staffing Anesthesiologist: Lenard SimmerKARENZ, ANDREW Resident/CRNA: Ginger CarneMICHELET, Stephania Macfarlane Performed: resident/CRNA   Preanesthetic Checklist Completed: patient identified, site marked, surgical consent, pre-op evaluation, timeout performed, IV checked, risks and benefits discussed and monitors and equipment checked  Epidural Patient position: sitting Prep: Betadine Patient monitoring: heart rate, continuous pulse ox and blood pressure Approach: midline Location: L4-L5 Injection technique: LOR saline  Needle:  Needle type: Tuohy  Needle gauge: 17 G Needle length: 9 cm and 9 Needle insertion depth: 8.5 cm Catheter type: closed end flexible Catheter size: 19 Gauge Catheter at skin depth: 12 cm Test dose: negative and 1.5% lidocaine with Epi 1:200 K  Assessment Sensory level: T10 Events: blood not aspirated, injection not painful, no injection resistance, negative IV test and no paresthesia  Additional Notes Pt. Evaluated and documentation done after procedure finished. Patient identified. Risks/Benefits/Options discussed with patient including but not limited to bleeding, infection, nerve damage, paralysis, failed block, incomplete pain control, headache, blood pressure changes, nausea, vomiting, reactions to medication both or allergic, itching and postpartum back pain. Confirmed with bedside nurse the patient's most recent platelet count. Confirmed with patient that they are not currently taking any anticoagulation, have any bleeding history or any family history of bleeding disorders. Patient expressed understanding and wished to proceed. All questions were answered. Sterile technique was used throughout the entire procedure. Please see nursing notes for vital signs. Test dose was given through epidural catheter and negative prior to continuing to dose epidural or start infusion. Warning  signs of high block given to the patient including shortness of breath, tingling/numbness in hands, complete motor block, or any concerning symptoms with instructions to call for help. Patient was given instructions on fall risk and not to get out of bed. All questions and concerns addressed with instructions to call with any issues or inadequate analgesia.   Patient tolerated the insertion well without immediate complications.Reason for block:procedure for pain

## 2016-09-25 NOTE — H&P (Signed)
OB History & Physical   History of Present Illness:  Chief Complaint: contractions  HPI:  Erin Valentine is a 21 y.o. G1P0 female at 8059w1d dated by LMP consistent with ultrasound.  Her pregnancy has been complicated by frequent UTIs during pregnancy (on macrobid suppression therapy).    She reports contractions.   She reports leakage of fluid.   She denies vaginal bleeding.   She reports fetal movement.    Maternal Medical History:   Past Medical History:  Diagnosis Date  . Kidney infection    Past Surgical History: No past surgical history on file.  Allergies: No Known Allergies  Prior to Admission medications   Medication Sig Start Date End Date Taking? Authorizing Provider  cephALEXin (KEFLEX) 500 MG capsule Take 1 capsule (500 mg total) by mouth 3 (three) times daily. 03/02/16   Fayrene Helperran, [place], PA-C  Prenatal Vit-Fe Fumarate-FA (PRENATAL MULTIVITAMIN) TABS tablet Take 1 tablet by mouth daily at 12 noon.    [provider]    OB History  Gravida Para Term Preterm AB Living  1            SAB TAB Ectopic Multiple Live Births               # Outcome Date GA Lbr Len/2nd Weight Sex Delivery Anes PTL Lv  1 Current               Prenatal care site: [facility]  Social History: She  reports that she has never smoked. She has never used smokeless tobacco. She reports that she does not drink alcohol or use drugs.  Family History: Denies family history of gyn cancer  Review of Systems: Negative x 10 systems reviewed except as noted in the HPI.    Physical Exam:  Vital Signs: BP 120/81 (BP Location: Left Arm)   Pulse 81   Temp 98.1 F (36.7 C) (Oral)   Resp 20   LMP 12/19/2015 (Approximate)   SpO2 97%  Constitutional: Well nourished, well developed female in no acute distress.  HEENT: normal Skin: Warm and dry.  Cardiovascular: Regular rate and rhythm.   Extremity: no edema  Respiratory: Clear to auscultation bilateral. Normal respiratory effort Abdomen:  gravid, tender to palpation (EFW 8 pounds) Back: no CVAT Neuro: DTRs 2+, Cranial nerves grossly intact Psych: Alert and Oriented x3. No memory deficits. Normal mood and affect.  MS: normal gait, normal bilateral lower extremity ROM/strength/stability.  Pelvic exam:  Cervix: 4.5 cm per RN, grossly ruptured   Pertinent Results:  Prenatal Labs: Blood type/Rh A positive  Antibody screen negative  Rubella Immune  Varicella Immune    RPR NR  HBsAg negative  HIV negative  GC negative  Chlamydia negative  Genetic screening Negative quad screen  1 hour GTT 83  3 hour GTT n/a  GBS positive    Baseline FHR: 125 beats/min   Variability: moderate   Accelerations: present   Decelerations: present (occasional short-duration, shallow variables) Contractions: present frequency: 3 q 10 min Overall assessment: cat 1 overall, occasionally cat 2  Assessment:  Erin Valentine is a 21 y.o. G1P0 female at 6859w1d with AROM and normal labor.   Plan:  1. Admit to Labor & Delivery  2. CBC, T&S, Clrs, IVF 3. GBS positive - PCN.   4. Fetwal well-being: reassuring overall 5. Expectant management, initially.   Thomasene MohairStephen Peri Kreft, MD 09/25/2016 8:17 AM

## 2016-09-25 NOTE — Discharge Instructions (Signed)
Vaginal Delivery, Care After Refer to this sheet in the next few weeks. These discharge instructions provide you with information on caring for yourself after delivery. Your caregiver may also give you specific instructions. Your treatment has been planned according to the most current medical practices available, but problems sometimes occur. Call your caregiver if you have any problems or questions after you go home. HOME CARE INSTRUCTIONS 1. Take over-the-counter or prescription medicines only as directed by your caregiver or pharmacist. 2. Do not drink alcohol, especially if you are breastfeeding or taking medicine to relieve pain. 3. Do not smoke tobacco. 4. Continue to use good perineal care. Good perineal care includes: 1. Wiping your perineum from back to front 2. Keeping your perineum clean. 3. You can do sitz baths twice a day, to help keep this area clean 5. Do not use tampons, douche or have sex until your caregiver says it is okay. 6. Shower only and avoid sitting in submerged water, aside from sitz baths 7. Wear a well-fitting bra that provides breast support. 8. Eat healthy foods. 9. Drink enough fluids to keep your urine clear or pale yellow. 10. Eat high-fiber foods such as whole grain cereals and breads, brown rice, beans, and fresh fruits and vegetables every day. These foods may help prevent or relieve constipation. 11. Avoid constipation with high fiber foods or medications, such as miralax or metamucil 12. Follow your caregiver's recommendations regarding resumption of activities such as climbing stairs, driving, lifting, exercising, or traveling. 13. Talk to your caregiver about resuming sexual activities. Resumption of sexual activities is dependent upon your risk of infection, your rate of healing, and your comfort and desire to resume sexual activity. 14. Try to have someone help you with your household activities and your newborn for at least a few days after you leave  the hospital. 15. Rest as much as possible. Try to rest or take a nap when your newborn is sleeping. 16. Increase your activities gradually. 17. Keep all of your scheduled postpartum appointments. It is very important to keep your scheduled follow-up appointments. At these appointments, your caregiver will be checking to make sure that you are healing physically and emotionally. SEEK MEDICAL CARE IF:   You are passing large clots from your vagina. Save any clots to show your caregiver.  You have a foul smelling discharge from your vagina.  You have trouble urinating.  You are urinating frequently.  You have pain when you urinate.  You have a change in your bowel movements.  You have increasing redness, pain, or swelling near your vaginal incision (episiotomy) or vaginal tear.  You have pus draining from your episiotomy or vaginal tear.  Your episiotomy or vaginal tear is separating.  You have painful, hard, or reddened breasts.  You have a severe headache.  You have blurred vision or see spots.  You feel sad or depressed.  You have thoughts of hurting yourself or your newborn.  You have questions about your care, the care of your newborn, or medicines.  You are dizzy or light-headed.  You have a rash.  You have nausea or vomiting.  You were breastfeeding and have not had a menstrual period within 12 weeks after you stopped breastfeeding.  You are not breastfeeding and have not had a menstrual period by the 12th week after delivery.  You have a fever. SEEK IMMEDIATE MEDICAL CARE IF:   You have persistent pain.  You have chest pain.  You have shortness of breath.    You faint.  You have leg pain.  You have stomach pain.  Your vaginal bleeding saturates two or more sanitary pads in 1 hour. MAKE SURE YOU:   Understand these instructions.  Will watch your condition.  Will get help right away if you are not doing well or get worse. Document Released:  04/21/2000 Document Revised: 09/08/2013 Document Reviewed: 12/20/2011 ExitCare Patient Information 2015 [employer]. This information is not intended to replace advice given to you by your health care provider. Make sure you discuss any questions you have with your health care provider.  Sitz Bath A sitz bath is a warm water bath taken in the sitting position. The water covers only the hips and butt (buttocks). We recommend using one that fits in the toilet, to help with ease of use and cleanliness. It may be used for either healing or cleaning purposes. Sitz baths are also used to relieve pain, itching, or muscle tightening (spasms). The water may contain medicine. Moist heat will help you heal and relax.  HOME CARE  Take 3 to 4 sitz baths a day. 18. Fill the bathtub half-full with warm water. 19. Sit in the water and open the drain a little. 20. Turn on the warm water to keep the tub half-full. Keep the water running constantly. 21. Soak in the water for 15 to 20 minutes. 22. After the sitz bath, pat the affected area dry. GET HELP RIGHT AWAY IF: You get worse instead of better. Stop the sitz baths if you get worse. MAKE SURE YOU:  Understand these instructions.  Will watch your condition.  Will get help right away if you are not doing well or get worse. Document Released: 06/01/2004 Document Revised: 01/17/2012 Document Reviewed: 08/22/2010 ExitCare Patient Information 2015 [employer]. This information is not intended to replace advice given to you by your health care provider. Make sure you discuss any questions you have with your health care provider.    

## 2016-09-25 NOTE — Anesthesia Preprocedure Evaluation (Signed)
Anesthesia Evaluation  Patient identified by MRN, date of birth, ID band Patient awake    Reviewed: Allergy & Precautions, H&P , NPO status , Patient's Chart, lab work & pertinent test resultsPreop documentation limited or incomplete due to emergent nature of procedure.  History of Anesthesia Complications Negative for: history of anesthetic complications  Airway        Dental   Pulmonary neg pulmonary ROS,           Cardiovascular Exercise Tolerance: Good negative cardio ROS       Neuro/Psych negative neurological ROS  negative psych ROS   GI/Hepatic negative GI ROS, Neg liver ROS,   Endo/Other  negative endocrine ROS  Renal/GU negative Renal ROS  negative genitourinary   Musculoskeletal   Abdominal   Peds  Hematology negative hematology ROS (+)   Anesthesia Other Findings   Reproductive/Obstetrics (+) Pregnancy                             Anesthesia Physical Anesthesia Plan  ASA: II  Anesthesia Plan: Epidural   Post-op Pain Management:    Induction:   Airway Management Planned:   Additional Equipment:   Intra-op Plan:   Post-operative Plan:   Informed Consent: I have reviewed the patients History and Physical, chart, labs and discussed the procedure including the risks, benefits and alternatives for the proposed anesthesia with the patient or authorized representative who has indicated his/her understanding and acceptance.   Dental Advisory Given  Plan Discussed with: Anesthesiologist  Anesthesia Plan Comments:         Anesthesia Quick Evaluation

## 2016-09-26 DIAGNOSIS — O99824 Streptococcus B carrier state complicating childbirth: Secondary | ICD-10-CM

## 2016-09-26 DIAGNOSIS — Z3A40 40 weeks gestation of pregnancy: Secondary | ICD-10-CM

## 2016-09-26 LAB — CBC
HEMATOCRIT: 35.1 % (ref 35.0–47.0)
HEMOGLOBIN: 12.1 g/dL (ref 12.0–16.0)
MCH: 31.8 pg (ref 26.0–34.0)
MCHC: 34.6 g/dL (ref 32.0–36.0)
MCV: 92 fL (ref 80.0–100.0)
Platelets: 131 10*3/uL — ABNORMAL LOW (ref 150–440)
RBC: 3.81 MIL/uL (ref 3.80–5.20)
RDW: 13.5 % (ref 11.5–14.5)
WBC: 11.8 10*3/uL — ABNORMAL HIGH (ref 3.6–11.0)

## 2016-09-26 LAB — RPR: RPR: NONREACTIVE

## 2016-09-26 NOTE — Anesthesia Postprocedure Evaluation (Signed)
Anesthesia Post Note  Patient: Erin Valentine  Procedure(s) Performed: * No procedures listed *  Patient location during evaluation: Mother Baby Anesthesia Type: Epidural Level of consciousness: awake and alert and oriented Pain management: satisfactory to patient Vital Signs Assessment: post-procedure vital signs reviewed and stable Respiratory status: respiratory function stable Cardiovascular status: blood pressure returned to baseline and stable Postop Assessment: no backache, no headache, epidural receding, patient able to bend at knees, no signs of nausea or vomiting and adequate PO intake Anesthetic complications: no     Last Vitals:  Vitals:   09/26/16 0436 09/26/16 0700  BP: (!) 102/57 104/60  Pulse: 66 72  Resp: 18 20  Temp: 36.7 C 36.7 C    Last Pain:  Vitals:   09/26/16 0912  TempSrc:   PainSc: 0-No pain                 Clydene PughBeane, Rubyann Lingle D

## 2016-09-26 NOTE — Progress Notes (Signed)
Post Partum Day 1 Subjective: Doing well, no complaints.  Tolerating regular diet, pain with PO meds, voiding and ambulating without difficulty.  No CP SOB F/C N/V or leg pain No HA, change of vision, RUQ/epigastric pain  Objective: BP 104/60 (BP Location: Left Arm)   Pulse 72   Temp 98 F (36.7 C) (Oral)   Resp 20   Ht 5\' 5"  (1.651 m)   Wt 155 lb 3.2 oz (70.4 kg)   LMP 12/19/2015 (Approximate)   SpO2 99%   Breastfeeding   BMI 25.83 kg/m    Physical Exam:  General: NAD CV: RRR Pulm: nl effort, CTABL Lochia: moderate Uterine Fundus: fundus firm and below umbilicus DVT Evaluation: no cords, ttp LEs    Recent Labs  09/25/16 0700 09/26/16 0600  HGB 14.0 12.1  HCT 40.0 35.1  WBC 11.1* 11.8*  PLT 151 131*    Assessment/Plan: 20 y.o. G1P1001 postpartum day # 1  1. Continue routine postpartum care 2. A+, Rubella Immune, Varicella Immune 3. TDAP status: patient is unsure if she had immunization during prenatal care 4. Breast/IUD 5. Disposition: Discharge to home tomorrow    Tresea MallJane Elsye Mccollister, CNM

## 2016-09-27 NOTE — Progress Notes (Signed)
Post Partum Day 2 Subjective: Doing well, no complaints.  Tolerating regular diet, pain with PO meds, voiding and ambulating without difficulty.  No CP SOB F/C N/V or leg pain No HA, change of vision, RUQ/epigastric pain  Objective: BP 108/65 (BP Location: Left Arm)   Pulse 65   Temp 98.1 F (36.7 C) (Oral)   Resp 18   Ht 5\' 5"  (1.651 m)   Wt 155 lb 3.2 oz (70.4 kg)   LMP 12/19/2015 (Approximate)   SpO2 99%   Breastfeeding? Unknown   BMI 25.83 kg/m    Physical Exam:  General: NAD CV: RRR Pulm: nl effort, CTABL Lochia: moderate Uterine Fundus: fundus firm and below umbilicus DVT Evaluation: no cords, ttp LEs    Recent Labs  09/25/16 0700 09/26/16 0600  HGB 14.0 12.1  HCT 40.0 35.1  WBC 11.1* 11.8*  PLT 151 131*    Assessment/Plan: 20 y.o. G1P1001 postpartum day # 2  1. Continue routine postpartum care 2. A+, Rubella Immune, Varicella Immune 3. TDAP status: patient is unsure if she had immunization during prenatal care 4. Breast/IUD 5. Disposition: Discharge to home  Letitia Libraobert Paul Farhan Jean, MD

## 2016-09-27 NOTE — Discharge Planning (Signed)
Discharge instructions for both mother and baby reviewed with patient and significant other.  Interpreter Kristeen MansMaritza Afanador interpreted instructions and also acknowledgement of understanding from patient.

## 2016-12-24 ENCOUNTER — Encounter: Payer: Self-pay | Admitting: Emergency Medicine

## 2016-12-24 DIAGNOSIS — S41112A Laceration without foreign body of left upper arm, initial encounter: Secondary | ICD-10-CM | POA: Insufficient documentation

## 2016-12-24 DIAGNOSIS — Y929 Unspecified place or not applicable: Secondary | ICD-10-CM | POA: Insufficient documentation

## 2016-12-24 DIAGNOSIS — X789XXA Intentional self-harm by unspecified sharp object, initial encounter: Secondary | ICD-10-CM | POA: Diagnosis not present

## 2016-12-24 DIAGNOSIS — R45851 Suicidal ideations: Secondary | ICD-10-CM | POA: Diagnosis not present

## 2016-12-24 DIAGNOSIS — Y999 Unspecified external cause status: Secondary | ICD-10-CM | POA: Diagnosis not present

## 2016-12-24 DIAGNOSIS — Z7289 Other problems related to lifestyle: Secondary | ICD-10-CM | POA: Insufficient documentation

## 2016-12-24 DIAGNOSIS — S4992XA Unspecified injury of left shoulder and upper arm, initial encounter: Secondary | ICD-10-CM | POA: Diagnosis present

## 2016-12-24 DIAGNOSIS — F4325 Adjustment disorder with mixed disturbance of emotions and conduct: Secondary | ICD-10-CM | POA: Diagnosis not present

## 2016-12-24 DIAGNOSIS — F329 Major depressive disorder, single episode, unspecified: Secondary | ICD-10-CM | POA: Insufficient documentation

## 2016-12-24 DIAGNOSIS — Y939 Activity, unspecified: Secondary | ICD-10-CM | POA: Insufficient documentation

## 2016-12-24 NOTE — ED Triage Notes (Signed)
Reports accidentally cut self on a broken glass.  Patient with large laceration to left wrist.  Bleeding controlled with pressure dressing.  Patient denies that this was attempt to harm self or that someone else harmed her.

## 2016-12-24 NOTE — ED Notes (Addendum)
Pt belongings include black shirt, green pants, underwear and bra. It also includes 1 red bracelet, 6 earrings, silver bracelet, and 1 black hair tie. Pt cell phone is also in belongings bag. While dressing out pt she informs that this incident was intentional. Pt states that she has felt lonely for the past couple of months. Dawn, RN informed.

## 2016-12-24 NOTE — ED Notes (Signed)
Informed by ED tech Mayra that patient is having suicidal thought and that she cut her wrist in attempt to harm self.

## 2016-12-25 ENCOUNTER — Emergency Department
Admission: EM | Admit: 2016-12-25 | Discharge: 2016-12-25 | Disposition: A | Discharge status: Home / Self Care | Payer: Medicaid Other | Attending: Emergency Medicine | Admitting: Emergency Medicine

## 2016-12-25 DIAGNOSIS — Z7289 Other problems related to lifestyle: Secondary | ICD-10-CM

## 2016-12-25 DIAGNOSIS — S61519A Laceration without foreign body of unspecified wrist, initial encounter: Secondary | ICD-10-CM

## 2016-12-25 DIAGNOSIS — F1994 Other psychoactive substance use, unspecified with psychoactive substance-induced mood disorder: Secondary | ICD-10-CM

## 2016-12-25 DIAGNOSIS — F32A Depression, unspecified: Secondary | ICD-10-CM

## 2016-12-25 DIAGNOSIS — F4325 Adjustment disorder with mixed disturbance of emotions and conduct: Secondary | ICD-10-CM

## 2016-12-25 DIAGNOSIS — F1010 Alcohol abuse, uncomplicated: Secondary | ICD-10-CM

## 2016-12-25 DIAGNOSIS — X789XXA Intentional self-harm by unspecified sharp object, initial encounter: Secondary | ICD-10-CM

## 2016-12-25 DIAGNOSIS — F329 Major depressive disorder, single episode, unspecified: Secondary | ICD-10-CM

## 2016-12-25 DIAGNOSIS — S41112A Laceration without foreign body of left upper arm, initial encounter: Secondary | ICD-10-CM

## 2016-12-25 LAB — COMPREHENSIVE METABOLIC PANEL
ALBUMIN: 4.8 g/dL (ref 3.5–5.0)
ALT: 45 U/L (ref 14–54)
ANION GAP: 11 (ref 5–15)
AST: 40 U/L (ref 15–41)
Alkaline Phosphatase: 58 U/L (ref 38–126)
BILIRUBIN TOTAL: 0.4 mg/dL (ref 0.3–1.2)
BUN: 10 mg/dL (ref 6–20)
CALCIUM: 9.2 mg/dL (ref 8.9–10.3)
CO2: 20 mmol/L — ABNORMAL LOW (ref 22–32)
Chloride: 110 mmol/L (ref 101–111)
Creatinine, Ser: 0.63 mg/dL (ref 0.44–1.00)
GFR calc Af Amer: >60 mL/min (ref >60)
GLUCOSE: 104 mg/dL — AB (ref 65–99)
POTASSIUM: 3.7 mmol/L (ref 3.5–5.1)
Sodium: 141 mmol/L (ref 135–145)
TOTAL PROTEIN: 8.1 g/dL (ref 6.5–8.1)

## 2016-12-25 LAB — CBC
HEMATOCRIT: 40.8 % (ref 35.0–47.0)
HEMOGLOBIN: 14.5 g/dL (ref 12.0–16.0)
MCH: 31.1 pg (ref 26.0–34.0)
MCHC: 35.6 g/dL (ref 32.0–36.0)
MCV: 87.3 fL (ref 80.0–100.0)
Platelets: 213 10*3/uL (ref 150–440)
RBC: 4.68 MIL/uL (ref 3.80–5.20)
RDW: 13.5 % (ref 11.5–14.5)
WBC: 9.9 10*3/uL (ref 3.6–11.0)

## 2016-12-25 LAB — ETHANOL: Alcohol, Ethyl (B): 177 mg/dL — ABNORMAL HIGH (ref <5)

## 2016-12-25 LAB — ACETAMINOPHEN LEVEL: Acetaminophen (Tylenol), Serum: <10 ug/mL — ABNORMAL LOW (ref 10–30)

## 2016-12-25 LAB — SALICYLATE LEVEL: Salicylate Lvl: <7 mg/dL (ref 2.8–30.0)

## 2016-12-25 MED ORDER — BACITRACIN ZINC 500 UNIT/GM EX OINT
TOPICAL_OINTMENT | Freq: Once | CUTANEOUS | Status: AC
  Administered 2016-12-25: 1 via TOPICAL
  Filled 2016-12-25: qty 0.9

## 2016-12-25 MED ORDER — LIDOCAINE HCL (PF) 1 % IJ SOLN
5.0000 mL | Freq: Once | INTRAMUSCULAR | Status: AC
  Administered 2016-12-25: 5 mL via INTRADERMAL
  Filled 2016-12-25: qty 5

## 2016-12-25 NOTE — ED Notes (Signed)
TTS at bedside w/ spanish medical interpreter at bedside.

## 2016-12-25 NOTE — ED Notes (Signed)
Dr Zenda Alpers in to suture laceration to left wrist

## 2016-12-25 NOTE — Consult Note (Signed)
[facility] Consult   Reason for Consult:  Consult for 21 year old woman brought in last night after cutting herself on the wrist Referring Physician:  Clearnce Hasten Patient Identification: Erin Valentine MRN:  [id number] Principal Diagnosis: Adjustment disorder with mixed disturbance of emotions and conduct Diagnosis:   Patient Active Problem List   Diagnosis Date Noted  . Self-inflicted laceration of wrist [S61.519A] 12/25/2016  . Adjustment disorder with mixed disturbance of emotions and conduct [F43.25] 12/25/2016  . Substance induced mood disorder ([place]) [F19.94] 12/25/2016  . Alcohol abuse [F10.10] 12/25/2016  . Postpartum care following vaginal delivery [Z39.2] 09/25/2016  . Severe sepsis ([place]) [A41.9, R65.20] 03/08/2015  . Pyelonephritis [N12] 03/06/2015    Total Time spent with patient: 1 hour  Subjective:   Ketty Bitton is a 21 y.o. female patient admitted with "I cut myself".  HPI:  Patient was interviewed with the assistance of a hospital provided Spanish language interpreter. 21 year old woman brought to the emergency room last night by her boyfriend after cutting herself on the wrist. Patient says that she intentionally cut herself with a piece of glass last night. He was a spur the moment thing and not something she had been planning on. She admits that she was intoxicated at the time. She says that recently she has been feeling sad mostly over worries about her extended family. Her family back in [place] are having a lot of social problems and she is unable to be there to help anyone. Despite this she says she is still able to enjoy her normal activities and enjoy being with her daughter. Sleeping and eating are okay. She denies having any suicidal thought or wish. Denies psychotic symptoms. Patient says that she had about "4 beers" last night at the home of a cousin. Normally she does not drink every day although perhaps has had a little more  drinking recently than usual. Denies other drugs. Not seeing anyone for outpatient mental healthcare.  Social history: Patient is from [place]. She lives here in town with her boyfriend. Some extended family locally. She just had a baby 3 months ago and is not currently working. She said her relationship with her boyfriend is good and denied any abuse.  Medical history: No significant medical problems. The laceration has been bandaged up and seems to be under control. Patient just gave birth to a baby 3 months ago  Substance abuse history: She denies that she's ever had a problem with drinking in the past. Denies history of DTs or seizures. Denies other drug use.  Past Psychiatric History: Patient denies past psychiatric treatment. Never seen a provider. Never been on any medicine. No past psychiatric hospitalizations. Denies ever having cut herself or try to kill her self in the past.  Risk to Self: Suicidal Ideation: No Suicidal Intent: No Is patient at risk for suicide?: No Suicidal Plan?: No Access to Means: No What has been your use of drugs/alcohol within the last 12 months?: Pt denies drug and alcohl use Intentional Self Injurious Behavior: Cutting Comment - Self Injurious Behavior: intentional laceration to left inner wrist Risk to Others: Homicidal Ideation: No Thoughts of Harm to Others: No Current Homicidal Intent: No Current Homicidal Plan: No Access to Homicidal Means: No History of harm to others?: No Assessment of Violence: None Noted Does patient have access to weapons?: No Criminal Charges Pending?: No Does patient have a court date: No Prior Inpatient Therapy: Prior Inpatient Therapy: No Prior Outpatient Therapy: Prior Outpatient Therapy: No Does patient  have an ACCT team?: No Does patient have Intensive In-House Services?  : No Does patient have [facility] services? : No Does patient have P4CC services?: No  Past Medical History:  Past Medical History:  Diagnosis  Date  . Kidney infection    No past surgical history on file. Family History: No family history on file. Family Psychiatric  History: Father is an alcoholic which is a major part of the stress she is going through. No family history of suicide Social History:  History  Alcohol Use No     History  Drug Use No    Social History   Social History  . Marital status: Single    Spouse name: N/A  . Number of children: N/A  . Years of education: N/A   Social History Main Topics  . Smoking status: Never Smoker  . Smokeless tobacco: Never Used  . Alcohol use No  . Drug use: No  . Sexual activity: Yes   Other Topics Concern  . Not on file   Social History Narrative  . No narrative on file   Additional Social History:    Allergies:  No Known Allergies  Labs:  Results for orders placed or performed during the hospital encounter of 12/25/16 (from the past 48 hour(s))  Comprehensive metabolic panel     Status: Abnormal   Collection Time: 12/24/16 11:52 PM  Result Value Ref Range   Sodium 141 135 - 145 mmol/L   Potassium 3.7 3.5 - 5.1 mmol/L   Chloride 110 101 - 111 mmol/L   CO2 20 (L) 22 - 32 mmol/L   Glucose, Bld 104 (H) 65 - 99 mg/dL   BUN 10 6 - 20 mg/dL   Creatinine, Ser 0.63 0.44 - 1.00 mg/dL   Calcium 9.2 8.9 - 10.3 mg/dL   Total Protein 8.1 6.5 - 8.1 g/dL   Albumin 4.8 3.5 - 5.0 g/dL   AST 40 15 - 41 U/L   ALT 45 14 - 54 U/L   Alkaline Phosphatase 58 38 - 126 U/L   Total Bilirubin 0.4 0.3 - 1.2 mg/dL   GFR calc non Af Amer >60 >60 mL/min   GFR calc Af Amer >60 >60 mL/min    Comment: (NOTE) The eGFR has been calculated using the CKD EPI equation. This calculation has not been validated in all clinical situations. eGFR's persistently <60 mL/min signify possible Chronic Kidney Disease.    Anion gap 11 5 - 15  Ethanol     Status: Abnormal   Collection Time: 12/24/16 11:52 PM  Result Value Ref Range   Alcohol, Ethyl (B) 177 (H) <5 mg/dL    Comment:          LOWEST DETECTABLE LIMIT FOR SERUM ALCOHOL IS 5 mg/dL FOR MEDICAL PURPOSES ONLY   Salicylate level     Status: None   Collection Time: 12/24/16 11:52 PM  Result Value Ref Range   Salicylate Lvl <4.1 2.8 - 30.0 mg/dL  Acetaminophen level     Status: Abnormal   Collection Time: 12/24/16 11:52 PM  Result Value Ref Range   Acetaminophen (Tylenol), Serum <10 (L) 10 - 30 ug/mL    Comment:        THERAPEUTIC CONCENTRATIONS VARY SIGNIFICANTLY. A RANGE OF 10-30 ug/mL MAY BE AN EFFECTIVE CONCENTRATION FOR MANY PATIENTS. HOWEVER, SOME ARE BEST TREATED AT CONCENTRATIONS OUTSIDE THIS RANGE. ACETAMINOPHEN CONCENTRATIONS >150 ug/mL AT 4 HOURS AFTER INGESTION AND >50 ug/mL AT 12 HOURS AFTER INGESTION ARE OFTEN ASSOCIATED WITH  TOXIC REACTIONS.   cbc     Status: None   Collection Time: 12/24/16 11:52 PM  Result Value Ref Range   WBC 9.9 3.6 - 11.0 K/uL   RBC 4.68 3.80 - 5.20 MIL/uL   Hemoglobin 14.5 12.0 - 16.0 g/dL   HCT 40.8 35.0 - 47.0 %   MCV 87.3 80.0 - 100.0 fL   MCH 31.1 26.0 - 34.0 pg   MCHC 35.6 32.0 - 36.0 g/dL   RDW 13.5 11.5 - 14.5 %   Platelets 213 150 - 440 K/uL    No current facility-administered medications for this encounter.    No current outpatient prescriptions on file.    Musculoskeletal: Strength & Muscle Tone: within normal limits Gait & Station: normal Patient leans: N/A  Psychiatric Specialty Exam: Physical Exam  Nursing note and vitals reviewed. Constitutional: She appears well-developed and well-nourished.  HENT:  Head: Normocephalic and atraumatic.  Eyes: Pupils are equal, round, and reactive to light. Conjunctivae are normal.  Neck: Normal range of motion.  Cardiovascular: Regular rhythm and normal heart sounds.   Respiratory: Effort normal. No respiratory distress.  GI: Soft.  Musculoskeletal: Normal range of motion.  Neurological: She is alert.  Skin: Skin is warm and dry.     Psychiatric: Judgment normal. Her affect is blunt. Her  speech is delayed. She is slowed. Thought content is not paranoid. She expresses no homicidal and no suicidal ideation. She exhibits abnormal recent memory.    Review of Systems  Constitutional: Negative.   HENT: Negative.   Eyes: Negative.   Respiratory: Negative.   Cardiovascular: Negative.   Gastrointestinal: Negative.   Musculoskeletal: Negative.   Skin: Negative.   Neurological: Negative.   Psychiatric/Behavioral: Positive for substance abuse. Negative for depression, hallucinations, memory loss and suicidal ideas. The patient is not nervous/anxious and does not have insomnia.     Blood pressure 115/71, pulse 67, temperature 98.6 F (37 C), temperature source Oral, resp. rate 17, height '5\' 5"'$  (1.651 m), weight 63.5 kg (140 lb), last menstrual period 12/03/2016, SpO2 99 %, unknown if currently breastfeeding.Body mass index is 23.3 kg/m.  General Appearance: Casual  Eye Contact:  Fair  Speech:  Slow  Volume:  Decreased  Mood:  Euthymic  Affect:  Constricted  Thought Process:  Goal Directed  Orientation:  Full (Time, Place, and Person)  Thought Content:  Logical  Suicidal Thoughts:  No  Homicidal Thoughts:  No  Memory:  Immediate;   Fair Recent;   Fair Remote;   Fair  Judgement:  Fair  Insight:  Fair  Psychomotor Activity:  Decreased  Concentration:  Concentration: Fair  Recall:  [employer] of Knowledge:  Fair  Language:  Fair  Akathisia:  No  Handed:  Right  AIMS (if indicated):     Assets:  Communication Skills Desire for Improvement Physical Health Resilience Social Support  ADL's:  Intact  Cognition:  WNL  Sleep:        Treatment Plan Summary: Plan 21 year old woman who cut herself on the wrist while intoxicated. Today her affect is blunted and down but she denies feeling hopeless and denies any suicidal thoughts. She denies symptoms of recent major depression. Insists that her sadness is related to some family problems. Patient is cooperative with treatment  and receptive to education. At this point I do not think she meets commitment criteria nor did she require inpatient treatment. Patient was advised to stop drinking alcohol and advised to consider getting into see someone for  mental health care particularly if her mood stays consistently down or certainly if she has any suicidal thoughts. She is agreeable to all of this. Case reviewed with emergency room doctor and TTS.  Disposition: Patient does not meet criteria for psychiatric inpatient admission. Supportive therapy provided about ongoing stressors.  Alethia Berthold, MD 12/25/2016 5:03 PM

## 2016-12-25 NOTE — ED Notes (Signed)
Pt was brought to the ED by boyfriend, who is intoxicated, with their infant in the car; pt understands currently baby is with staff until her cousin can come for baby; pt knows pt is currently sleeping and safe;

## 2016-12-25 NOTE — ED Notes (Signed)
Pt denies SI/HI at this time.  

## 2016-12-25 NOTE — ED Notes (Signed)
Informed pt that her cousin Devra Dopp and friend Shanda Bumps have arrived to take her baby to their house; [facility] phone number [phone]

## 2016-12-25 NOTE — ED Notes (Signed)
Pt ambulatory to stat registration desk holding her left arm and noted to have a large laceration to inner side with moderate amt of bleeding noted. Arm wrapped with pressure dressing and pt taken to triage. Pt has strong smell of alcohol about her and her eyes are blood shot. Pt came in with female who had blood on his clothes and was holding a small approx 69 month old child. Female appears to be talking to patient like he is upset with her. Female also has strong odor of alcohol about him and blood shot eyes.  Couple separated.

## 2016-12-25 NOTE — ED Notes (Signed)
DSS is at bedside with DSS interpreter.

## 2016-12-25 NOTE — ED Notes (Signed)
Dr. Webster at bedside.  

## 2016-12-25 NOTE — ED Notes (Addendum)
Pt resting in bed; tearful; says she was at her cousin's house, drinking with family/friends when her boyfriend showed up to take her and their baby home; she and her boyfriend got into a verbal argument at their residence and he told her to leave, meaning to move out; pt says she doesn't have a job and was trying to tell him she'd get a job to care for her and the baby; the arguing escalated from there and she was upset and felt alone; this led her to cut herself for the first time; pt denies feeling suicidal; denies history of psychiatric issues or ever being evaluated by a mental health provider; pt understands infant child is currently with hospital staff member while attempts are made for pt's cousin to come pick up the infant;

## 2016-12-25 NOTE — BH Assessment (Signed)
Tele Assessment Note   Erin Valentine is an 21 y.o. female presenting involuntarily for assessment with c/o intentional laceration to left inner wrist. Pt denies self-harm to be suicide attempt. Pt denies SI. Pt denies h/o self harm. Pt states she does not know why she cut herself. Pt resides with her boyfriend. Pt identifies current stressors as having no one to talk to and states "sometimes I feel very alone". Pt endorses multiple symptoms of depression. Pt denies HI and reports no h/o violence or aggression. Pt denies AVTOH. Pt does not appear to be responding to internal stimuli or experiencing delusional thought content. Pt is requesting assistance with obtaining OPT.  Diagnosis: Depression  Past Medical History:  Past Medical History:  Diagnosis Date  . Kidney infection     No past surgical history on file.  Family History: No family history on file.  Social History:  reports that she has never smoked. She has never used smokeless tobacco. She reports that she does not drink alcohol or use drugs.  Additional Social History:  Alcohol / Drug Use Pain Medications: Pt denies abuse. Prescriptions: Pt denies abuse. Over the Counter: Pt denies abuse. History of alcohol / drug use?: No history of alcohol / drug abuse  CIWA: CIWA-Ar BP: 118/69 Pulse Rate: 87 COWS:    PATIENT STRENGTHS: (choose at least two) Average or above average intelligence General fund of knowledge  Allergies: No Known Allergies  Home Medications:  (Not in a hospital admission)  OB/GYN Status:  Patient's last menstrual period was 12/03/2016 (approximate).  General Assessment Data Location of Assessment: [facility] ED TTS Assessment: In system Is this a Tele or Face-to-Face Assessment?: Face-to-Face Is this an Initial Assessment or a Re-assessment for this encounter?: Initial Assessment Marital status: Single Living Arrangements: Spouse/significant other (boyfriend) Can pt return to current living  arrangement?: Yes Admission Status: Involuntary Is patient capable of signing voluntary admission?: No (IVC) Insurance type: Medicaid     Crisis Care Plan Living Arrangements: Spouse/significant other (boyfriend) Name of Psychiatrist: None Name of Therapist: Nonoe  Education Status Is patient currently in school?: No Highest grade of school patient has completed: 10  Risk to self with the past 6 months Suicidal Ideation: No Has patient been a risk to self within the past 6 months prior to admission? : No Suicidal Intent: No Has patient had any suicidal intent within the past 6 months prior to admission? : No Is patient at risk for suicide?: No Suicidal Plan?: No Has patient had any suicidal plan within the past 6 months prior to admission? : No Access to Means: No What has been your use of drugs/alcohol within the last 12 months?: Pt denies drug and alcohl use Previous Attempts/Gestures: No Intentional Self Injurious Behavior: Cutting Comment - Self Injurious Behavior: intentional laceration to left inner wrist Family Suicide History: No Persecutory voices/beliefs?: No Depression: Yes Depression Symptoms: Tearfulness, Feeling angry/irritable, Feeling worthless/self pity, Fatigue Substance abuse history and/or treatment for substance abuse?: No Suicide prevention information given to non-admitted patients: Not applicable  Risk to Others within the past 6 months Homicidal Ideation: No Does patient have any lifetime risk of violence toward others beyond the six months prior to admission? : No Thoughts of Harm to Others: No Current Homicidal Intent: No Current Homicidal Plan: No Access to Homicidal Means: No History of harm to others?: No Assessment of Violence: None Noted Does patient have access to weapons?: No Criminal Charges Pending?: No Does patient have a court date: No Is  patient on probation?: No  Psychosis Hallucinations: None noted Delusions: None  noted  Mental Status Report Appearance/Hygiene: In scrubs Eye Contact: Good Motor Activity: Unremarkable Speech: Logical/coherent, Soft Level of Consciousness: Alert, Quiet/awake Mood: Sad Affect: Sad Anxiety Level: None Thought Processes: Coherent, Relevant Judgement: Unimpaired Orientation: Person, Place, Situation Obsessive Compulsive Thoughts/Behaviors: None  Cognitive Functioning Concentration: Normal Memory: Recent Intact, Remote Intact IQ: Average Insight: Fair Impulse Control: Poor Appetite: Good Weight Loss: 0 Weight Gain:  (Pt unable to quantify) Sleep: No Change Total Hours of Sleep: 8 Vegetative Symptoms: None  ADLScreening [facility] Assessment Services) Patient's cognitive ability adequate to safely complete daily activities?: Yes Patient able to express need for assistance with ADLs?: Yes Independently performs ADLs?: Yes (appropriate for developmental age)  Prior Inpatient Therapy Prior Inpatient Therapy: No  Prior Outpatient Therapy Prior Outpatient Therapy: No Does patient have an ACCT team?: No Does patient have Intensive In-House Services?  : No Does patient have [facility] services? : No Does patient have P4CC services?: No  ADL Screening (condition at time of admission) Patient's cognitive ability adequate to safely complete daily activities?: Yes Is the patient deaf or have difficulty hearing?: No Does the patient have difficulty seeing, even when wearing glasses/contacts?: No Does the patient have difficulty concentrating, remembering, or making decisions?: No Patient able to express need for assistance with ADLs?: Yes Does the patient have difficulty dressing or bathing?: No Independently performs ADLs?: Yes (appropriate for developmental age) Does the patient have difficulty walking or climbing stairs?: No Weakness of Legs: None Weakness of Arms/Hands: None  Home Assistive Devices/Equipment Home Assistive Devices/Equipment: None  Therapy  Consults (therapy consults require a physician order) PT Evaluation Needed: No OT Evalulation Needed: No SLP Evaluation Needed: No Abuse/Neglect Assessment (Assessment to be complete while patient is alone) Physical Abuse: Denies Verbal Abuse: Denies Sexual Abuse: Denies Exploitation of patient/patient's resources: Denies Self-Neglect: Denies Values / Beliefs Cultural Requests During Hospitalization: None Spiritual Requests During Hospitalization: None Consults Spiritual Care Consult Needed: No Social Work Consult Needed: No Merchant navy officer (For Healthcare) Does Patient Have a Medical Advance Directive?: No Would patient like information on creating a medical advance directive?: No - Patient declined    Additional Information 1:1 In Past 12 Months?: No CIRT Risk: No Elopement Risk: No Does patient have medical clearance?: Yes     Disposition:  Disposition Initial Assessment Completed for this Encounter: Yes Disposition of Patient: Other dispositions Other disposition(s): Other (Comment) (Pending psychiatric consult/recommendation)  Erin Valentine 12/25/2016 11:03 AM

## 2016-12-25 NOTE — ED Notes (Signed)
Pt ambulatory to bathroom with steady gait; given sterile container for urine specimen;

## 2016-12-25 NOTE — ED Notes (Signed)
Pt calm and cooperative at this point,

## 2016-12-25 NOTE — ED Notes (Signed)
Pt to speak to [facility] with TTS shortly

## 2016-12-25 NOTE — ED Provider Notes (Signed)
[facility] Emergency Department Provider Note   ____________________________________________   First MD Initiated Contact with Patient 12/25/16 [phone]     (approximate)  I have reviewed the triage vital signs and the nursing notes.   HISTORY  Chief Complaint Laceration and Psychiatric Evaluation    HPI Erin Valentine is a 21 y.o. female who comes into the hospital today with a laceration to her left forearm. The patient states that she was not trying to kill herself she wishes cutting herself. She reports that she feels alone. Her parents are in [place] when she slipped here in [place] for the past 4 years. She is here with cousins but she also has a boyfriend. She reports that they have had some troubles and he started to take her daughter away from her. The patient states that she has a 41-month-old daughter. She states that she feels depressed and she can't sleep. I did ask her if this all got worse after she had the baby and she initially confirmed that it was but then she said that her daughter makes her feel better. The patient states that she drank 3 beers tonight and did no drugs. The patient came in to the hospital for evaluation. She reports that she feels that she needs to see a psychologist but she does not have insurance to see anyone. The patient is here tonight for evaluation.   Past Medical History:  Diagnosis Date  . Kidney infection     Patient Active Problem List   Diagnosis Date Noted  . Postpartum care following vaginal delivery 09/25/2016  . Severe sepsis (HCC) 03/08/2015  . Pyelonephritis 03/06/2015    No past surgical history on file.  Prior to Admission medications   Not on File    Allergies Patient has no known allergies.  No family history on file.  Social History Social History  Substance Use Topics  . Smoking status: Never Smoker  . Smokeless tobacco: Never Used  . Alcohol use No    Review of  Systems  Constitutional: No fever/chills Eyes: No visual changes. ENT: No sore throat. Cardiovascular: Denies chest pain. Respiratory: Denies shortness of breath. Gastrointestinal: No abdominal pain.  No nausea, no vomiting.  No diarrhea.  No constipation. Genitourinary: Negative for dysuria. Musculoskeletal: Negative for back pain. Skin: Laceration Neurological: Negative for headaches, focal weakness or numbness. Psychiatric:Depression, cutting   ____________________________________________   PHYSICAL EXAM:  VITAL SIGNS: ED Triage Vitals  Enc Vitals Group     BP 12/24/16 2307 134/90     Pulse Rate 12/24/16 2307 (!) 112     Resp 12/24/16 2307 (!) 24     Temp 12/24/16 2307 97.8 F (36.6 C)     Temp Source 12/24/16 2307 Oral     SpO2 12/24/16 2307 99 %     Weight 12/24/16 2308 140 lb (63.5 kg)     Height 12/24/16 2308 5\' 5"  (1.651 m)     Head Circumference --      Peak Flow --      Pain Score 12/24/16 2307 9     Pain Loc --      Pain Edu? --      Excl. in GC? --     Constitutional: Alert and oriented. Well appearing and in Mild distress. Eyes: Conjunctivae are normal. PERRL. EOMI. Head: Atraumatic. Nose: No congestion/rhinnorhea. Mouth/Throat: Mucous membranes are moist.  Oropharynx non-erythematous. Cardiovascular: Normal rate, regular rhythm. Grossly normal heart sounds.  Good peripheral circulation. Respiratory: Normal  respiratory effort.  No retractions. Lungs CTAB. Gastrointestinal: Soft and nontender. No distention. Positive bowel sounds Musculoskeletal: No lower extremity tenderness nor edema.   Neurologic:  Normal speech and language. Skin:  Skin is warm, dry approximate 9 cm laceration to left forearm near wrist. Psychiatric: Mood and affect are normal.   ____________________________________________   LABS (all labs ordered are listed, but only abnormal results are displayed)  Labs Reviewed  COMPREHENSIVE METABOLIC PANEL - Abnormal; Notable for the  following:       Result Value   CO2 20 (*)    Glucose, Bld 104 (*)    All other components within normal limits  ETHANOL - Abnormal; Notable for the following:    Alcohol, Ethyl (B) 177 (*)    All other components within normal limits  ACETAMINOPHEN LEVEL - Abnormal; Notable for the following:    Acetaminophen (Tylenol), Serum <10 (*)    All other components within normal limits  SALICYLATE LEVEL  CBC  URINE DRUG SCREEN, QUALITATIVE ([facility] ONLY)  POC URINE PREG, ED   ____________________________________________  EKG  none ____________________________________________  RADIOLOGY  No results found.  ____________________________________________   PROCEDURES  Procedure(s) performed: please, see procedure note(s).  Marland Kitchen.Laceration Repair Date/Time: 12/25/2016 4:45 AM Performed by: Rebecka Apley Authorized by: Rebecka Apley   Consent:    Consent obtained:  Verbal   Consent given by:  Patient Anesthesia (see MAR for exact dosages):    Anesthesia method:  Local infiltration   Local anesthetic:  Lidocaine 1% w/o epi Laceration details:    Location:  Shoulder/arm   Shoulder/arm location:  L lower arm   Length (cm):  9 Repair type:    Repair type:  Simple Pre-procedure details:    Preparation:  Patient was prepped and draped in usual sterile fashion Exploration:    Contaminated: no   Treatment:    Area cleansed with:  Betadine and saline   Amount of cleaning:  Standard Skin repair:    Repair method:  Sutures   Suture size:  4-0   Suture material:  Nylon   Suture technique:  Running locked   Number of sutures:  11 Approximation:    Approximation:  Loose Post-procedure details:    Dressing:  Antibiotic ointment and non-adherent dressing   Patient tolerance of procedure:  Tolerated well, no immediate complications    Critical Care performed: No  ____________________________________________   INITIAL IMPRESSION / ASSESSMENT AND PLAN / ED  COURSE  Pertinent labs & imaging results that were available during my care of the patient were reviewed by me and considered in my medical decision making (see chart for details).  This is a 21 year old female who comes into the hospital today after cutting her wrists. The patient told me that she was not trying to kill herself she was just cutting. The patient though has never cut in the past. She is very tearful and crying while she is having this conversation. The patient is saying she feels alone and she has no one. I did inform the patient that I felt she should stay and be seen by psychiatry. I did repair the patient's wound. I initially wanted to have the patient seen by tele-psychiatry but they did not have a Spanish interpreter available. The patient will be seen by psych here.      ____________________________________________   FINAL CLINICAL IMPRESSION(S) / ED DIAGNOSES  Final diagnoses:  Depression, unspecified depression type  Deliberate self-cutting  Laceration of left upper extremity, initial encounter  NEW MEDICATIONS STARTED DURING THIS VISIT:  New Prescriptions   No medications on file     Note:  This document was prepared using Dragon voice recognition software and may include unintentional dictation errors.    Rebecka Apley, MD 12/25/16 [phone]

## 2016-12-25 NOTE — ED Provider Notes (Signed)
-----------------------------------------   5:30 PM on 12/25/2016 -----------------------------------------   Blood pressure 115/71, pulse 67, temperature 98.6 F (37 C), temperature source Oral, resp. rate 17, height 5\' 5"  (1.651 m), weight 63.5 kg (140 lb), last menstrual period 12/03/2016, SpO2 99 %, unknown if currently breastfeeding.  The patient had no acute events since last update.  Calm and cooperative at this time.  Patient evaluated by Dr. Toni Amend and is clinically sober at this time.Patient does not have any suicidal or homicidal ideation. Dr. Toni Amend recommends that the patientfollow-up with [facility].    Myrna Blazer, MD 12/25/16 [phone]

## 2016-12-25 NOTE — ED Notes (Addendum)
Pt standing in hallway, argumentative about leaving to go to another hospital; pt understands MD will be in to see her soon; pt mad that nobody has been in to see her yet; informed pt that I had was her nurse and I had already introduced myself to her; pt continues to argue with staff about leaving; large dressing noted to left wrist

## 2016-12-25 NOTE — ED Notes (Signed)
Pt resting quietly in bed; calm and cooperative; bacitracin and small dressing applied to left wrist; pt given water to drink as requested

## 2016-12-25 NOTE — ED Notes (Signed)
Pt states she has a safe place to stay.

## 2017-01-03 ENCOUNTER — Encounter: Payer: Self-pay | Admitting: Emergency Medicine

## 2017-01-03 ENCOUNTER — Emergency Department
Admission: EM | Admit: 2017-01-03 | Discharge: 2017-01-03 | Disposition: A | Discharge status: Home / Self Care | Payer: Medicaid Other | Attending: Emergency Medicine | Admitting: Emergency Medicine

## 2017-01-03 DIAGNOSIS — K640 First degree hemorrhoids: Secondary | ICD-10-CM

## 2017-01-03 DIAGNOSIS — S61512D Laceration without foreign body of left wrist, subsequent encounter: Secondary | ICD-10-CM | POA: Diagnosis present

## 2017-01-03 DIAGNOSIS — Z4802 Encounter for removal of sutures: Secondary | ICD-10-CM | POA: Diagnosis not present

## 2017-01-03 DIAGNOSIS — X58XXXD Exposure to other specified factors, subsequent encounter: Secondary | ICD-10-CM | POA: Diagnosis not present

## 2017-01-03 DIAGNOSIS — L309 Dermatitis, unspecified: Secondary | ICD-10-CM | POA: Insufficient documentation

## 2017-01-03 MED ORDER — HYDROCORTISONE 1 % EX OINT: 1.0000 "application " | TOPICAL_OINTMENT | Freq: Two times a day (BID) | CUTANEOUS | 0 refills | Status: DC

## 2017-01-03 MED ORDER — HYDROCORTISONE ACETATE 25 MG RE SUPP: 25.0000 mg | Freq: Two times a day (BID) | RECTAL | 1 refills | Status: AC

## 2017-01-03 MED ORDER — HYDROCORTISONE ACETATE 25 MG RE SUPP
25.0000 mg | Freq: Once | RECTAL | Status: AC
  Administered 2017-01-03: 25 mg via RECTAL
  Filled 2017-01-03: qty 1

## 2017-01-03 NOTE — ED Notes (Signed)
Pt states that she needs to have her sutures removed in her left arm. She also wants to be checked for a rash on her right elbow area and she states that when she poops it hurts and there is blood when she wipes.

## 2017-01-03 NOTE — ED Triage Notes (Signed)
Patient here for suture removal to left wrist. Patient also with complaint of rash to right lower arm.

## 2017-01-03 NOTE — ED Notes (Signed)
Sutures removed and steri-strips placed

## 2017-01-03 NOTE — Discharge Instructions (Signed)
Keep the wound clean, dry, and covered. The wound tape will fall off in about a week. Use the suppository as directed. Consider taking Miralax or colace to soften your stools. You may also take Fibercon, Metamucil, or some other fiber supplement. There is a OTC Nupercaine gel for pain relief. Follow-up with [facility] for continued symptoms.

## 2017-01-03 NOTE — ED Provider Notes (Signed)
[facility] Emergency Department Provider Note ____________________________________________  Time seen: 2210  I have reviewed the triage vital signs and the nursing notes.  HISTORY  Chief Complaint  Suture / Staple Removal and Rash  HPI Erin Valentine is a 21 y.o. female presents to the ED for suture removal status post laceration. 2 her left wrist. She also has a secondary, unrelated complaint, of rectal pain and bleeding. Patient reports hard, firm, difficult stools over the last several months. She's had intermittent episodes of constipation and bright red blood on toilet tissue. She describes symptoms have been persistent since the delivery of her daughter 2 months ago. She denies any fevers, chills, sweats. He also denies any nausea, vomiting, or dizziness.  Past Medical History:  Diagnosis Date  . Kidney infection     Patient Active Problem List   Diagnosis Date Noted  . Self-inflicted laceration of wrist 12/25/2016  . 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 Substance induced mood disorder (HCC) 12/25/2016  . Alcohol abuse 12/25/2016  . Postpartum care following vaginal delivery 09/25/2016  . Severe sepsis (HCC) 03/08/2015  . Pyelonephritis 03/06/2015    History reviewed. No pertinent surgical history.  Prior to Admission medications   Medication Sig Start Date End Date Taking? Authorizing Provider  hydrocortisone (ANUSOL-HC) 25 MG suppository Place 1 suppository (25 mg total) rectally every 12 (twelve) hours. 01/03/17 01/15/17  Jaclyn Carew, Charlesetta IvoryJenise V Bacon, PA-C  hydrocortisone 1 % ointment Apply 1 application topically 2 (two) times daily. 01/03/17   Derrin Currey, Charlesetta IvoryJenise V Bacon, PA-C    Allergies Patient has no known allergies.  No family history on file.  Social History Social History  Substance Use Topics  . Smoking status: Never Smoker  . Smokeless tobacco: Never Used  . Alcohol use No    Review  of Systems  Constitutional: Negative for fever. Cardiovascular: Negative for chest pain. Respiratory: Negative for shortness of breath. Gastrointestinal: Negative for abdominal pain, vomiting and diarrhea. Rectal pain as above. Genitourinary: Negative for dysuria. Musculoskeletal: Negative for back pain. Skin: Negative for rash. Sutures as above Neurological: Negative for headaches, focal weakness or numbness. ____________________________________________  PHYSICAL EXAM:  VITAL SIGNS: ED Triage Vitals  Enc Vitals Group     BP 01/03/17 2109 120/75     Pulse Rate 01/03/17 2109 68     Resp 01/03/17 2109 18     Temp 01/03/17 2109 98.2 F (36.8 C)     Temp Source 01/03/17 2109 Oral     SpO2 01/03/17 2109 100 %     Weight 01/03/17 2109 140 lb (63.5 kg)     Height 01/03/17 2109 5\' 5"  (1.651 m)     Head Circumference --      Peak Flow --      Pain Score 01/03/17 2206 0     Pain Loc --      Pain Edu? --      Excl. in GC? --     Constitutional: Alert and oriented. Well appearing and in no distress. Head: Normocephalic and atraumatic. Cardiovascular: Normal rate, regular rhythm. Normal distal pulses. Respiratory: Normal respiratory effort. No wheezes/rales/rhonchi. Gastrointestinal: Soft and nontender. No distention. No obvious external hemorrhoids. There is a moderate inferior fissure noted. Normal rectal tone. No palpable internal hemorrhoids noted.  Musculoskeletal: Nontender with normal range of motion in all extremities.  Neurologic:  Normal gait without ataxia. Normal speech and language. No gross focal neurologic deficits are appreciated. Skin:  Skin is  warm, dry and intact. No rash noted. A few scattered papules noted to the right elbow within a previous tattoo. No erythema, induration, or blister formation  ____________________________________________  PROCEDURES  Hydrocortisone rectal suppository  SUTURE REMOVAL Performed by: RN Consent: Verbal consent  obtained. Patient identity confirmed: provided demographic data Time out: Immediately prior to procedure a "time out" was called to verify the correct patient, procedure, equipment, support staff and site/side marked as required.  Location details: left wrist  Wound Appearance: clean  Sutures/Staples Removed: 11 running, locked nylon sutures Steri-strips placed over wound.   Facility: sutures placed in this facility Patient tolerance: Patient tolerated the procedure well with no immediate complications. ____________________________________________  INITIAL IMPRESSION / ASSESSMENT AND PLAN / ED COURSE  Patient was ED evaluation of rectal pain and rectal bleeding, as well as suture removal. She is given a prescription for hydrocortisone suppositories and instructions to increase her fiber intake. The wound is healing well and Steri-Strips are placed after successful suture removal. She will follow-up with [facility] for ongoing management. ____________________________________________  FINAL CLINICAL IMPRESSION(S) / ED DIAGNOSES  Final diagnoses:  Visit for suture removal  Grade I hemorrhoids  Dermatitis      [name], Charlesetta Ivory, PA-C 01/03/17 2313    Merrily Brittle, MD 01/07/17 2254

## 2017-03-07 ENCOUNTER — Emergency Department
Admission: EM | Admit: 2017-03-07 | Discharge: 2017-03-07 | Disposition: A | Discharge status: Home / Self Care | Payer: Medicaid Other | Attending: Emergency Medicine | Admitting: Emergency Medicine

## 2017-03-07 DIAGNOSIS — N10 Acute tubulo-interstitial nephritis: Secondary | ICD-10-CM | POA: Diagnosis not present

## 2017-03-07 DIAGNOSIS — R109 Unspecified abdominal pain: Secondary | ICD-10-CM | POA: Diagnosis present

## 2017-03-07 DIAGNOSIS — N12 Tubulo-interstitial nephritis, not specified as acute or chronic: Secondary | ICD-10-CM

## 2017-03-07 LAB — URINALYSIS, COMPLETE (UACMP) WITH MICROSCOPIC
Bilirubin Urine: NEGATIVE
Glucose, UA: NEGATIVE mg/dL
Hgb urine dipstick: NEGATIVE
Ketones, ur: 80 mg/dL — AB
Nitrite: NEGATIVE
Protein, ur: 30 mg/dL — AB
Specific Gravity, Urine: 1.019 (ref 1.005–1.030)
pH: 6 (ref 5.0–8.0)

## 2017-03-07 LAB — POCT PREGNANCY, URINE: Preg Test, Ur: POSITIVE — AB

## 2017-03-07 MED ORDER — LEVOFLOXACIN 750 MG PO TABS: 750.0000 mg | ORAL_TABLET | Freq: Every day | ORAL | 0 refills | Status: AC

## 2017-03-07 NOTE — ED Provider Notes (Signed)
[facility] Emergency [facility]  ____________________________________________  Time seen: Approximately 10:11 PM  I have reviewed the triage vital signs and the nursing notes.   HISTORY  Chief Complaint Back Pain    HPI Erin Valentine is a 21 y.o. female presents to the emergency department with bilateral flank pain, dysuria and increased urinary frequency.  Patient also reports that she has had chills nausea.  Patient reports that her symptoms are consistent with prior episodes of pyelonephritis.  Patient reports that she is approximately 3 days late.  Patient recently became aware that she is pregnant.  She denies vaginal bleeding, abdominal pain or pelvic pain.  No alleviating measures have been attempted.   Past Medical History:  Diagnosis Date  . Kidney infection     Patient Active Problem List   Diagnosis Date Noted  . Self-inflicted laceration of wrist 12/25/2016  . 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 Substance induced mood disorder (HCC) 12/25/2016  . Alcohol abuse 12/25/2016  . Postpartum care following vaginal delivery 09/25/2016  . Severe sepsis (HCC) 03/08/2015  . Pyelonephritis 03/06/2015    No past surgical history on file.  Prior to Admission medications   Medication Sig Start Date End Date Taking? Authorizing Provider  hydrocortisone 1 % ointment Apply 1 application topically 2 (two) times daily. 01/03/17   Menshew, Charlesetta IvoryJenise V Bacon, PA-C  levofloxacin (LEVAQUIN) 750 MG tablet Take 1 tablet (750 mg total) by mouth daily. 03/07/17 03/12/17  Orvil FeilWoods, Jaclyn M, PA-C    Allergies Patient has no known allergies.  No family history on file.  Social History Social History  Substance Use Topics  . Smoking status: Never Smoker  . Smokeless tobacco: Never Used  . Alcohol use No     Review of Systems  Constitutional: Patient has had chills.  Eyes: No visual changes. No  discharge ENT: No upper respiratory complaints. Cardiovascular: no chest pain. Respiratory: no cough. No SOB. Gastrointestinal: No abdominal pain. Patient has had nausea. Genitourinary: Patient has dysuria, increased urinary frequency and flank pain.  Musculoskeletal: Negative for musculoskeletal pain. Skin: Negative for rash, abrasions, lacerations, ecchymosis. Neurological: Negative for headaches, focal weakness or numbness.   ____________________________________________   PHYSICAL EXAM:  VITAL SIGNS: ED Triage Vitals  Enc Vitals Group     BP 03/07/17 2052 110/68     Pulse Rate 03/07/17 2052 90     Resp 03/07/17 2052 18     Temp 03/07/17 2052 99 F (37.2 C)     Temp Source 03/07/17 2052 Oral     SpO2 03/07/17 2052 96 %     Weight 03/07/17 2050 130 lb (59 kg)     Height 03/07/17 2050 5\' 3"  (1.6 m)     Head Circumference --      Peak Flow --      Pain Score 03/07/17 2049 8     Pain Loc --      Pain Edu? --      Excl. in GC? --      Constitutional: Alert and oriented. Well appearing and in no acute distress. Eyes: Conjunctivae are normal. PERRL. EOMI. Head: Atraumatic. Cardiovascular: Normal rate, regular rhythm. Normal S1 and S2.  Good peripheral circulation. Respiratory: Normal respiratory effort without tachypnea or retractions. Lungs CTAB. Good air entry to the bases with no decreased or absent breath sounds. Gastrointestinal: Bowel sounds 4 quadrants. Soft and nontender to palpation. No guarding or rigidity. No palpable masses. No distention.  No CVA tenderness. Musculoskeletal: Full range of motion to all extremities. No gross deformities appreciated. Neurologic:  Normal speech and language. No gross focal neurologic deficits are appreciated.  Skin:  Skin is warm, dry and intact. No rash noted. Psychiatric: Mood and affect are normal. Speech and behavior are normal. Patient exhibits appropriate insight and  judgement.   ____________________________________________   LABS (all labs ordered are listed, but only abnormal results are displayed)  Labs Reviewed  URINALYSIS, COMPLETE (UACMP) WITH MICROSCOPIC - Abnormal; Notable for the following:       Result Value   Color, Urine YELLOW (*)    APPearance CLEAR (*)    Ketones, ur 80 (*)    Protein, ur 30 (*)    Leukocytes, UA TRACE (*)    Bacteria, UA RARE (*)    Squamous Epithelial / LPF 0-5 (*)    All other components within normal limits  POCT PREGNANCY, URINE - Abnormal; Notable for the following:    Preg Test, Ur POSITIVE (*)    All other components within normal limits  POC URINE PREG, ED   ____________________________________________  EKG   ____________________________________________  RADIOLOGY   No results found.  ____________________________________________    PROCEDURES  Procedure(s) performed:    Procedures    Medications - No data to display   ____________________________________________   INITIAL IMPRESSION / ASSESSMENT AND PLAN / ED COURSE  Pertinent labs & imaging results that were available during my care of the patient were reviewed by me and considered in my medical decision making (see chart for details).  Review of the [place] CSRS was performed in accordance of the [facility] prior to dispensing any controlled drugs.    Assessment and plan Pyelonephritis  Patient presents to the emergency department with dysuria, increased urinary frequency and bilateral flank pain in association with chills and nausea.  Differential diagnosis includes acute cystitis and pyelonephritis.  Given patient's past medical history and frequent episodes of pyelonephritis, patient was treated empirically with Levaquin.  She was advised to continue taking prenatal vitamins and to establish care with an OB/GYN.  Patient voiced understanding regarding this recommendation.  Vital signs are reassuring prior to discharge. All patient  questions were answered.  ____________________________________________  FINAL CLINICAL IMPRESSION(S) / ED DIAGNOSES  Final diagnoses:  Pyelonephritis      NEW MEDICATIONS STARTED DURING THIS VISIT:  Discharge Medication List as of 03/07/2017  9:45 PM    START taking these medications   Details  levofloxacin (LEVAQUIN) 750 MG tablet Take 1 tablet (750 mg total) by mouth daily., Starting Wed 03/07/2017, Until Mon 03/12/2017, Print            This chart was dictated using voice recognition software/Dragon. Despite best efforts to proofread, errors can occur which can change the meaning. Any change was purely unintentional.    Orvil Feil, PA-C 03/07/17 2218    Dionne Bucy, MD 03/07/17 2337

## 2017-03-07 NOTE — ED Triage Notes (Signed)
Pt in with co low back pain states hx of UTI and feels the same. Denies any abd pain or dysuria. Pt is pregnant but unsure of gestation. No other complaints.

## 2017-04-01 IMAGING — US US RENAL
1 series · 14 of 25 positions shown · non-contrast
Comparison: Prior CT from 06/06/2015.

CLINICAL DATA: Initial evaluation for acute bilateral flank pain
for 6 hours.

EXAM:
RENAL / URINARY TRACT ULTRASOUND COMPLETE

[Series 1: us renal · 0.23mm/px · 14 of 30 slices shown]
[im 1/30]
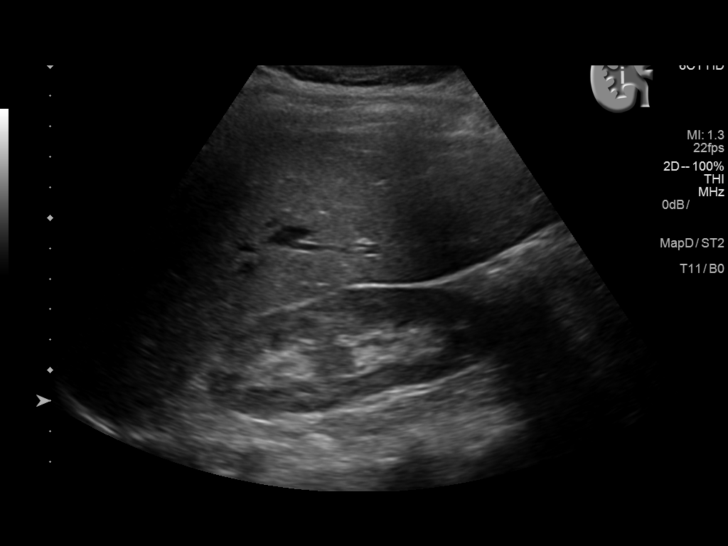
[im 3/30]
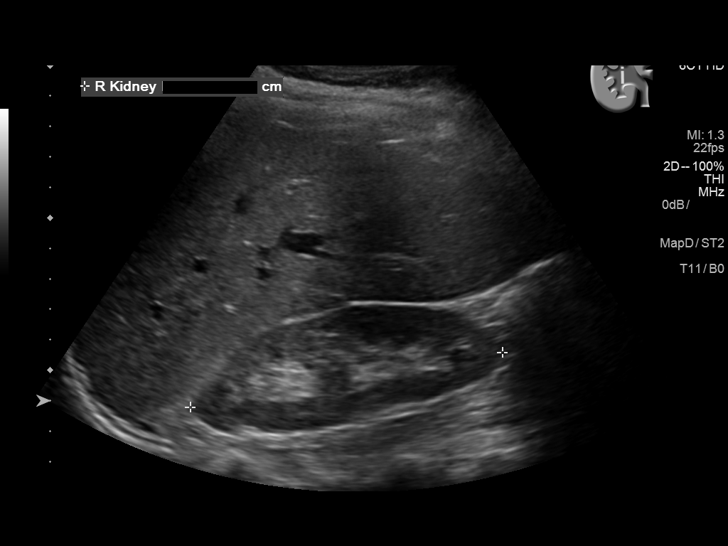
[im 5/30]
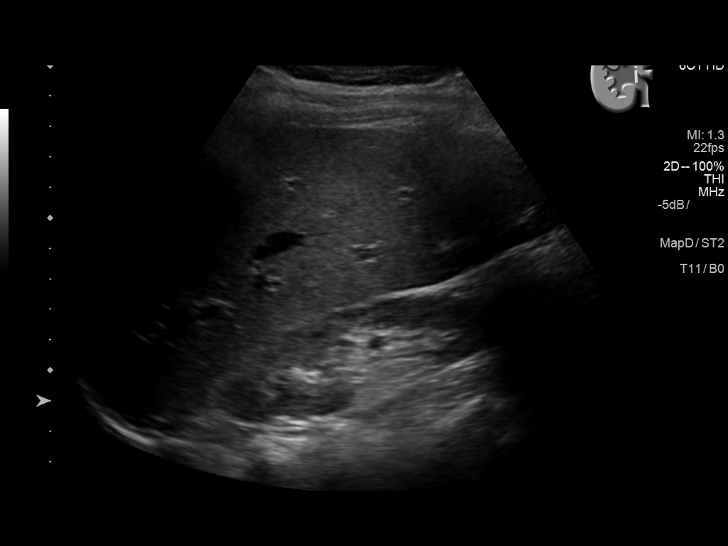
[im 8/30]
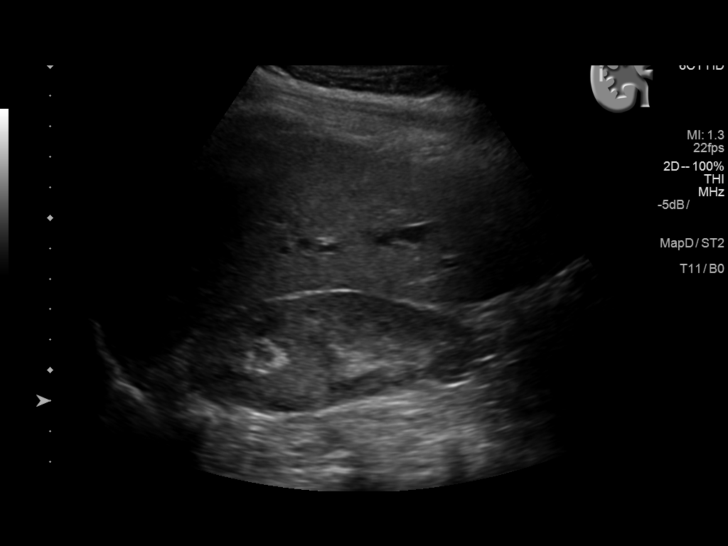
[im 10/30]
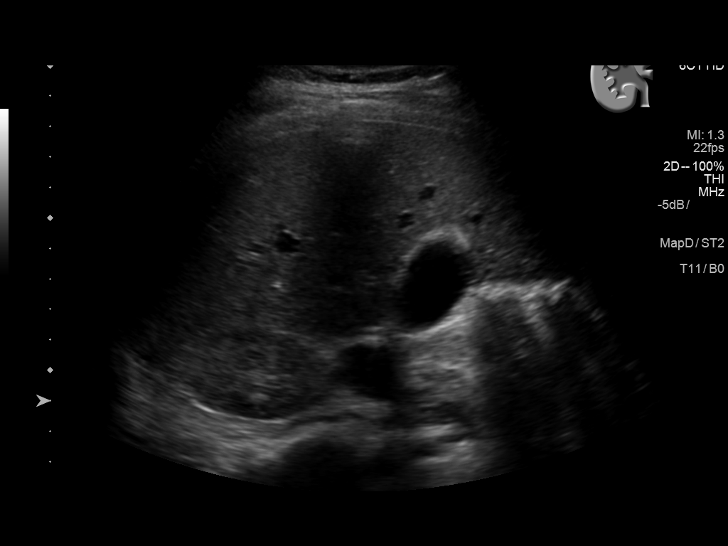
[im 11/30]
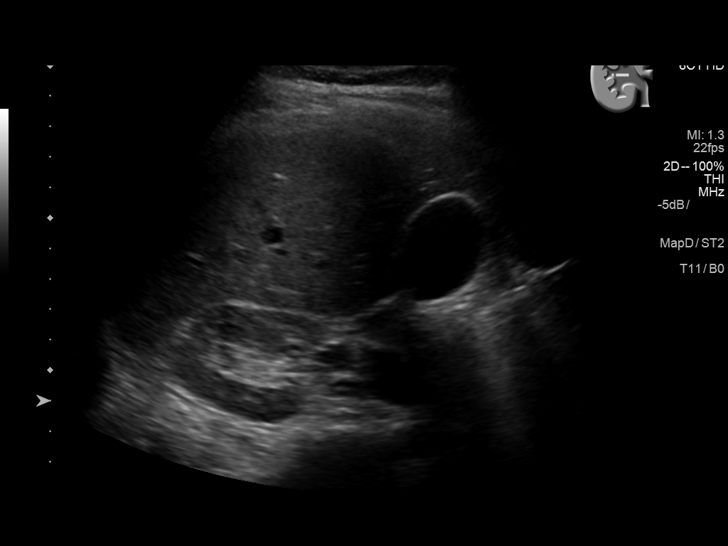
[im 14/30]
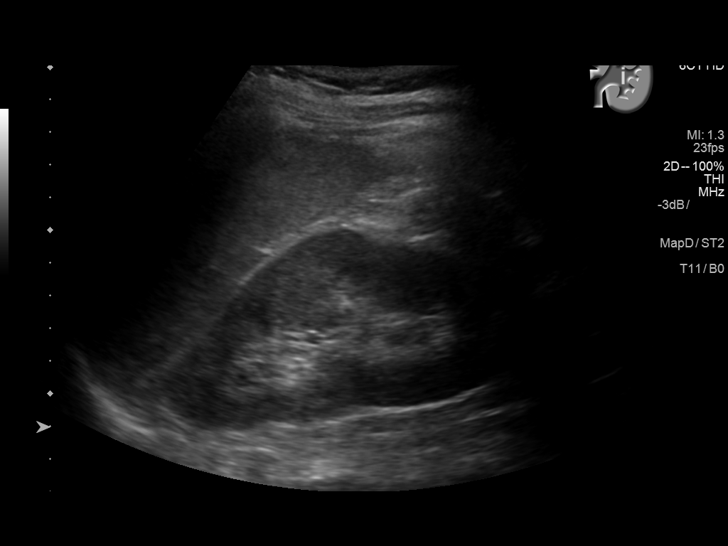
[im 16/30]
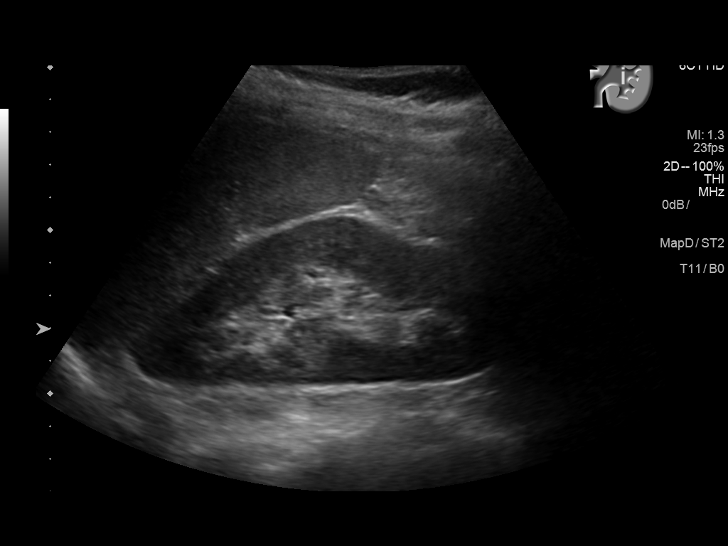
[im 19/30]
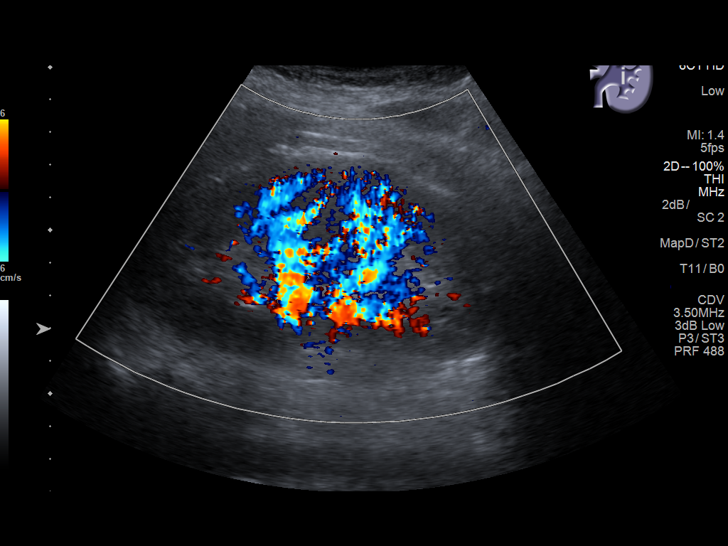
[im 20/30]
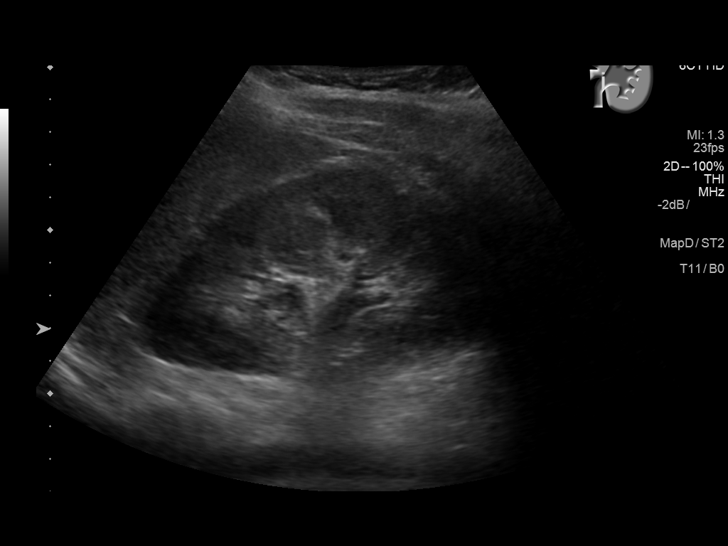
[im 22/30]
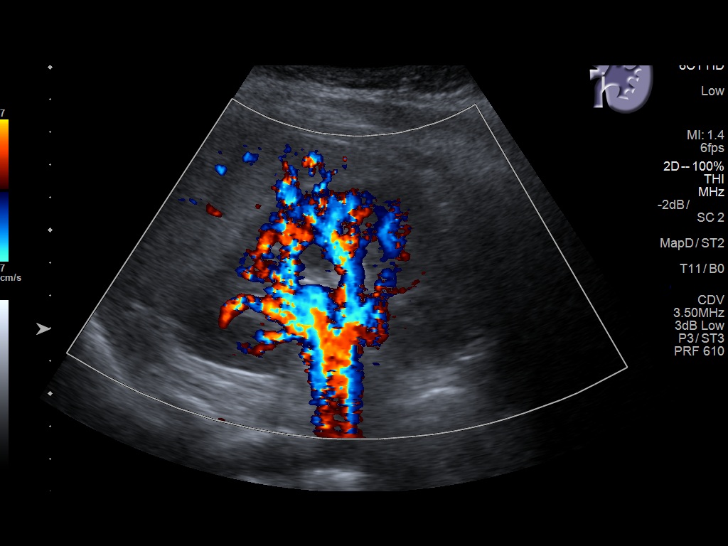
[im 25/30]
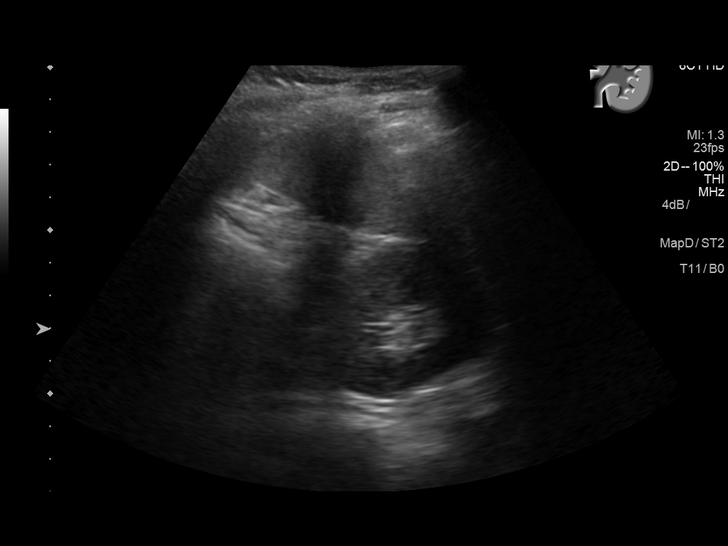
[im 27/30]
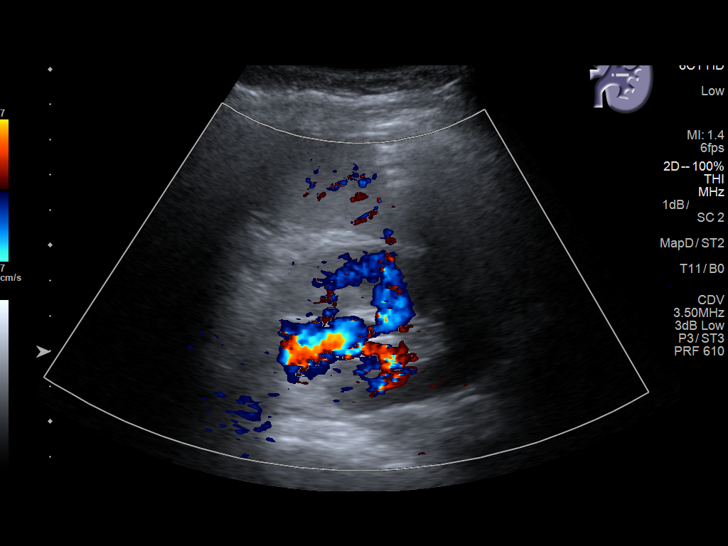
[im 30/30]
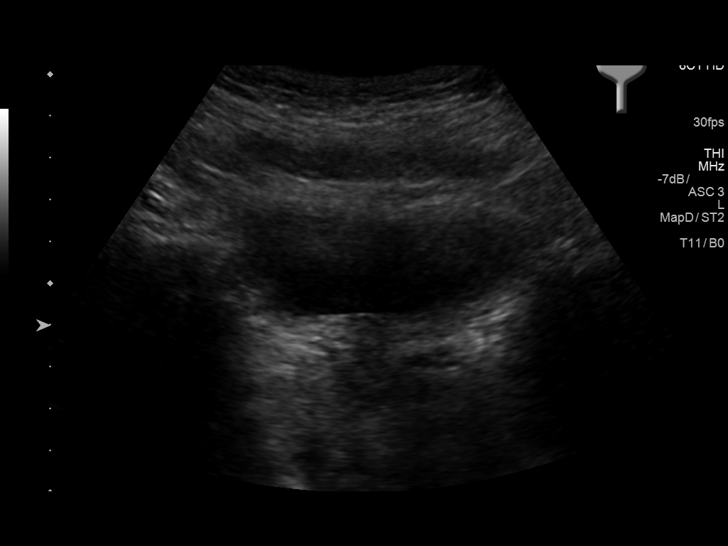

[14 of 25 positions shown; findings below may reference images not displayed]

FINDINGS: Right Kidney:

Length: 10.4 cm. Echogenicity within normal limits. No mass or
hydronephrosis visualized.

Left Kidney:

Length: 11.4 cm. Echogenicity within normal limits. No mass or
hydronephrosis visualized.

Bladder:

Appears normal for degree of bladder distention.
IMPRESSION: Normal renal ultrasound.  No acute abnormality identified.

## 2017-04-02 ENCOUNTER — Other Ambulatory Visit: Payer: Self-pay | Admitting: Advanced Practice Midwife

## 2017-04-02 DIAGNOSIS — Z369 Encounter for antenatal screening, unspecified: Secondary | ICD-10-CM

## 2017-04-02 DIAGNOSIS — IMO0002 Reserved for concepts with insufficient information to code with codable children: Secondary | ICD-10-CM | POA: Insufficient documentation

## 2017-04-26 ENCOUNTER — Ambulatory Visit: Payer: Medicaid Other

## 2017-05-08 NOTE — L&D Delivery Note (Addendum)
Delivery Note Primary OB: [facility] Delivery Provider: Marcelyn Bruins, CNM Gestational Age: Premature at 56 w 5d weeks gestation Antepartum complications: none Intrapartum complications: Preterm Labor and PROM  A viable female was delivered via vertex presentation.  A nuchal cord was present. Delivery of the shoulders and body followed without difficulty. The infant was placed on the maternal abdomen. The umbilical cord was doubly clamped and cut following delayed cord clamping. The placenta was delivered spontaneously and was inspected and found to be intact with a three vessel cord. The cervix and vagina were inspected. A 1st degree perineal laceration was repaired with 3-0 Vicryl. The fundus was firm. Patient and infant were bonding in stable condition. All counts were correct.  Apgars: 7, 9  Weight:  6 lb 4.5 oz.   Placenta status: spontaneous and intact. Cord: 3 vessels; with the following complications: none.  Anesthesia:  epidural Episiotomy:  none Lacerations:  1st Suture Repair: 3.0 vicryl Est. Blood Loss (mL):  350  Mom to postpartum.  Baby to Couplet care / Skin to Skin.  Dr. Jean Rosenthal was present for observation/assistance with the delivery.  Marcelyn Bruins, CNM [facility], [facility] 10/01/2017  1:44 PM  I was present and supervised the entire delivery of the baby and placenta.  I also supervised the first degree repair.  I agree with the documentation as noted above.  Thomasene Mohair, MD, [facility], [facility] 10/01/2017 2:30 PM

## 2017-05-12 IMAGING — US US OB TRANSVAGINAL
1 series · 14 of 28 positions shown · non-contrast
Comparison: 01/21/2016.

CLINICAL DATA: Right-sided kidney pain.

EXAM:
OBSTETRIC <14 WK US AND TRANSVAGINAL OB US
TECHNIQUE: Both transabdominal and transvaginal ultrasound examinations were
performed for complete evaluation of the gestation as well as the
maternal uterus, adnexal regions, and pelvic cul-de-sac.
Transvaginal technique was performed to assess early pregnancy.

[Series 1: us ob transvaginal · 0.23mm/px · 14 of 82 slices shown]
[im 4/82]
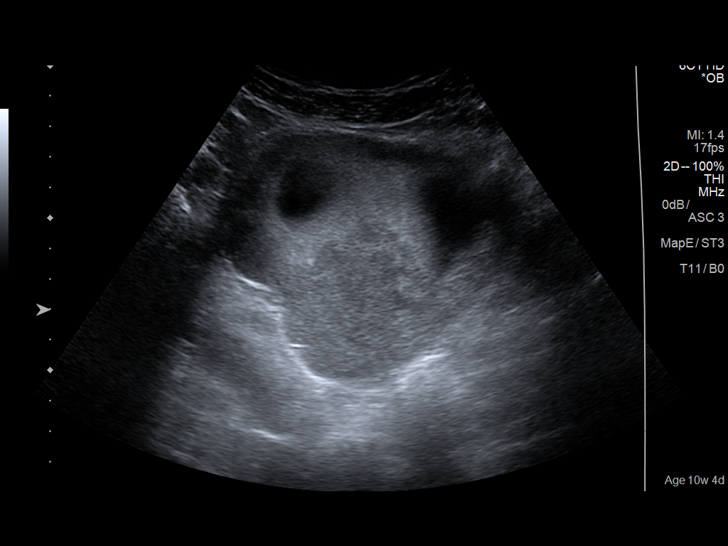
[im 10/82]
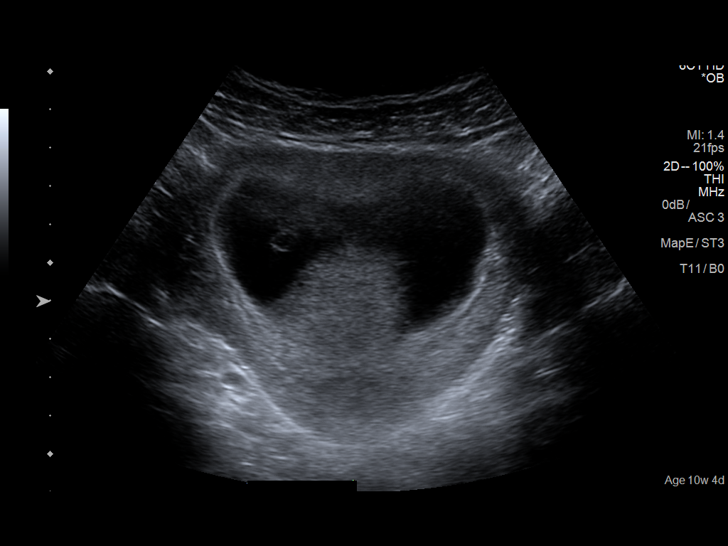
[im 16/82]
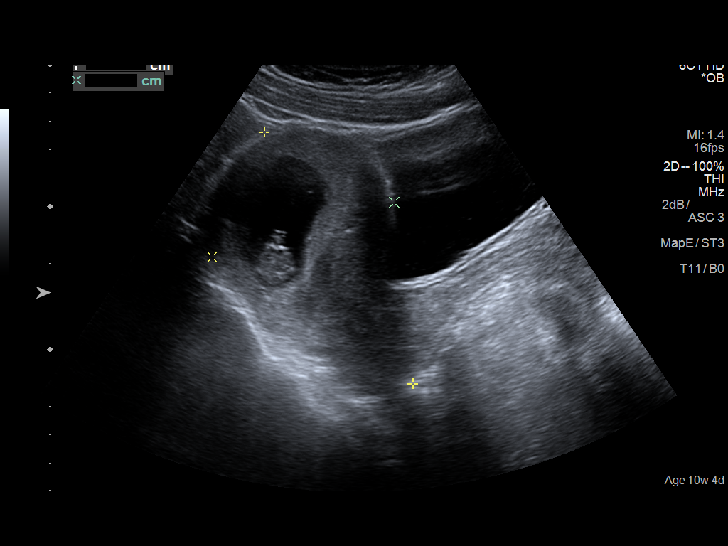
[im 22/82]
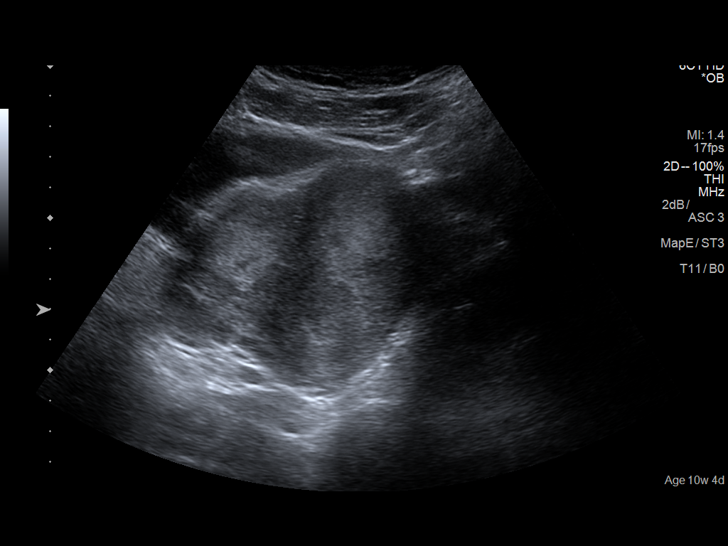
[im 28/82]
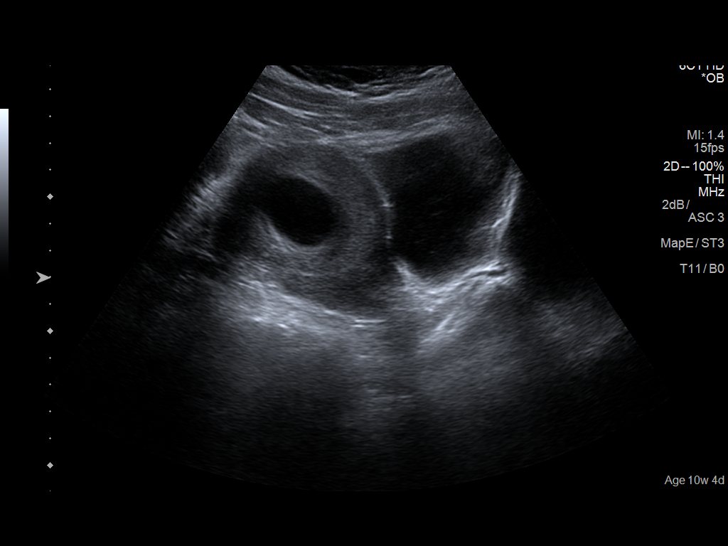
[im 34/82]
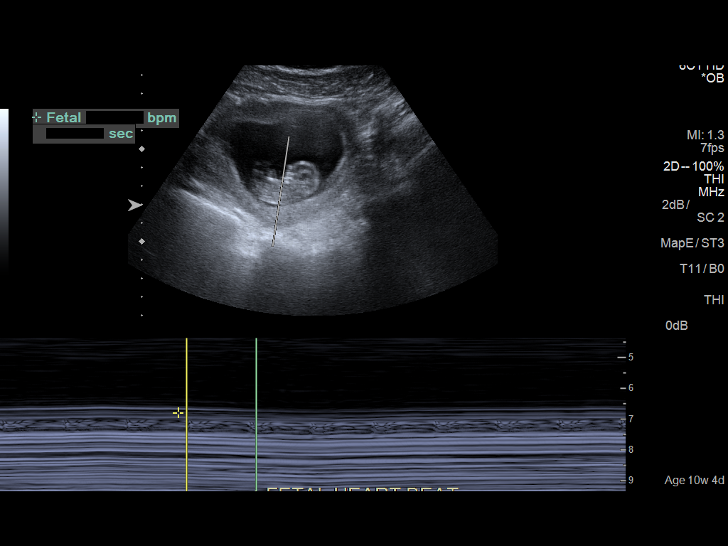
[im 40/82]
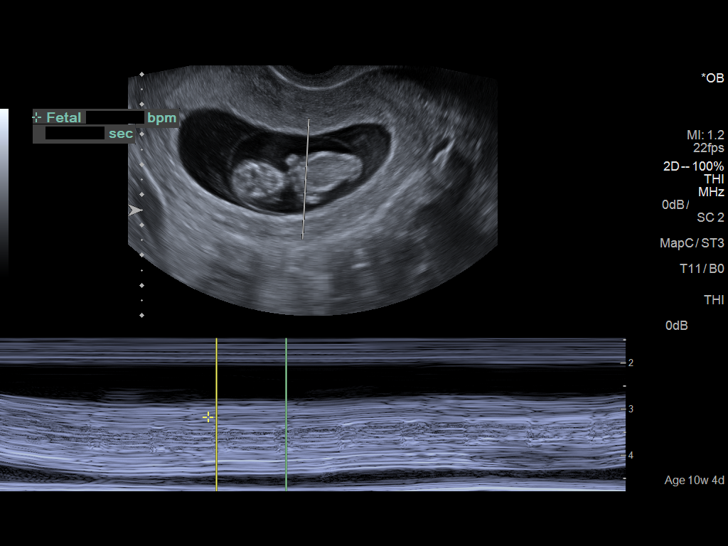
[im 46/82]
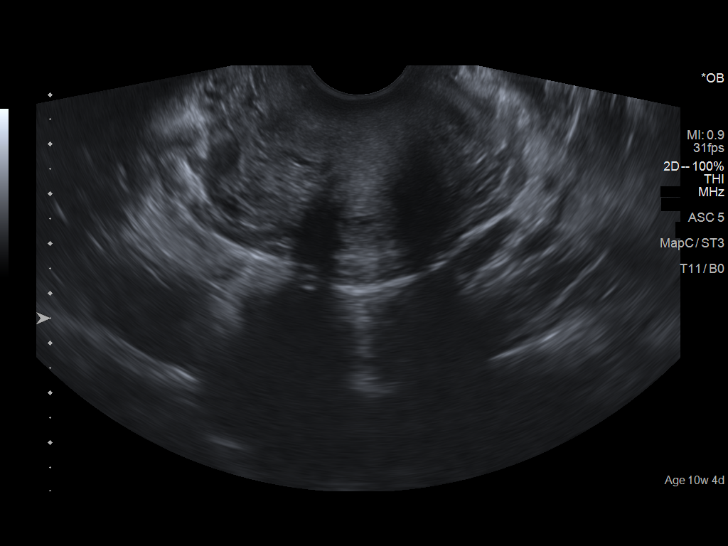
[im 52/82]
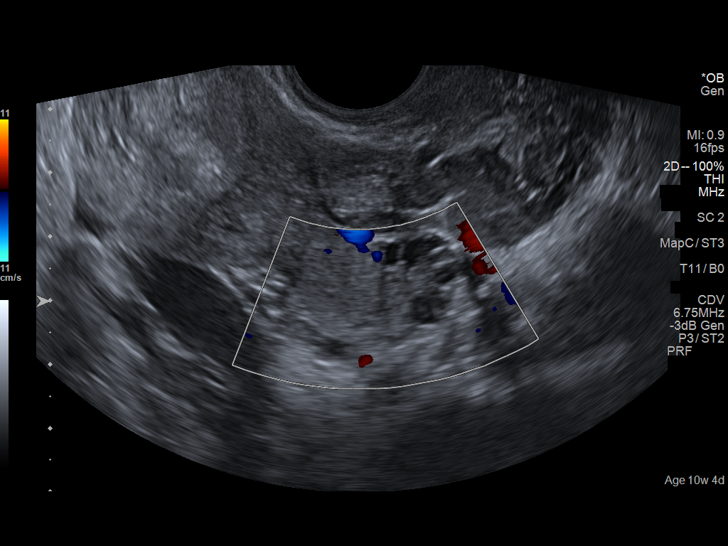
[im 58/82]
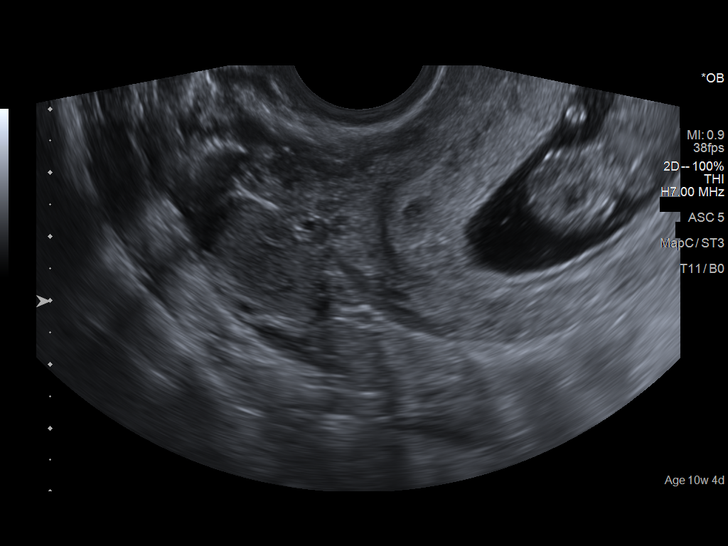
[im 64/82]
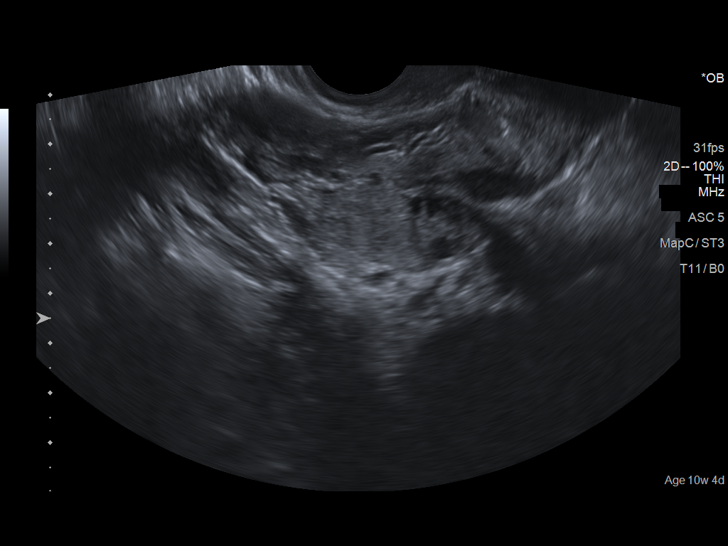
[im 70/82]
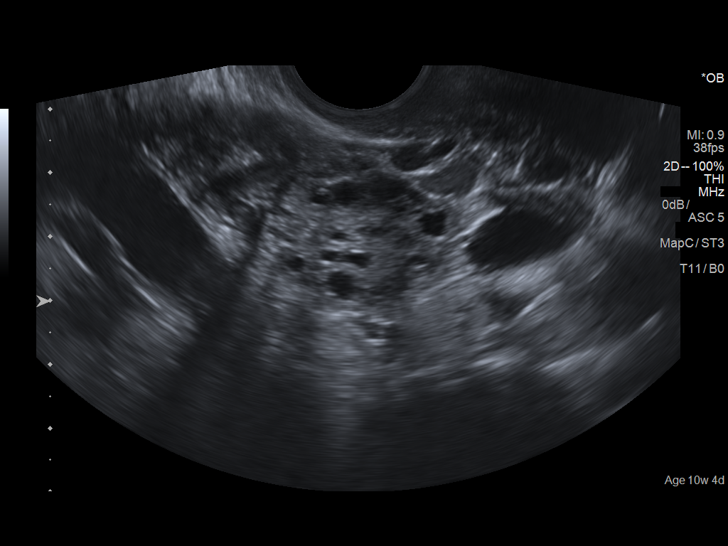
[im 76/82]
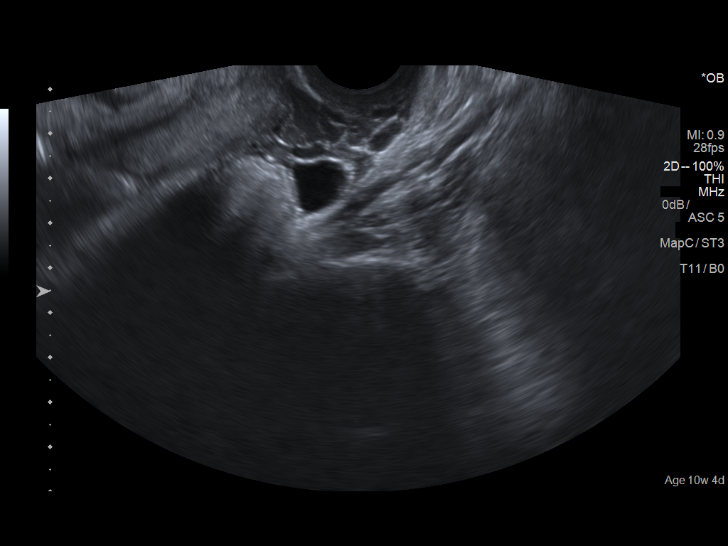
[im 82/82]
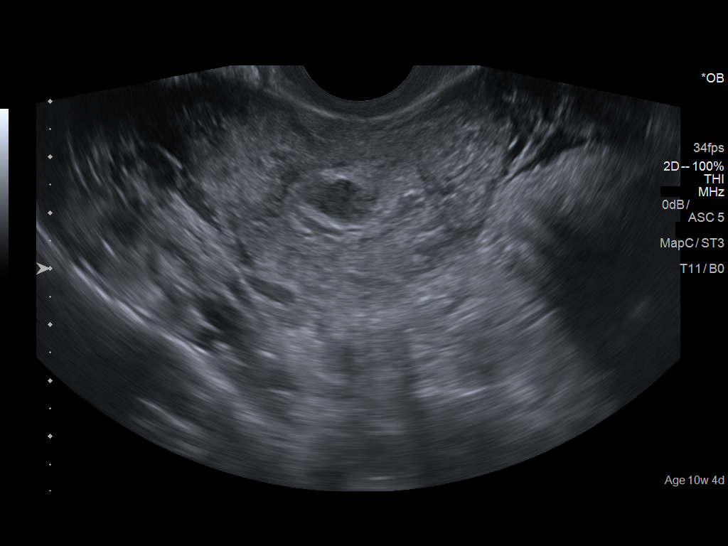

[14 of 28 positions shown; findings below may reference images not displayed]

FINDINGS: Intrauterine gestational sac: Single

Yolk sac:  Present

Embryo:  Present

Cardiac Activity: Present

Heart Rate: 147  bpm

CRL:  2.95 cm 10 w   6 d                  US EDC: 09/22/2016

Subchorionic hemorrhage:  Small.

Maternal uterus/adnexae: 1.7 cm right hypoechoic region possibly
residual corpus luteal cyst. Trace amount of free pelvic fluid .
IMPRESSION: Single viable intrauterine pregnancy at 10 weeks 6 days. Small
subchorionic hemorrhage noted. Trace free pelvic fluid. Follow-up
exam can be obtained to demonstrate resolution of the subchorionic
hemorrhage.

## 2017-09-24 LAB — HM HIV SCREENING LAB: HM HIV Screening: NEGATIVE

## 2017-10-01 ENCOUNTER — Inpatient Hospital Stay: Payer: Medicaid Other | Admitting: Anesthesiology

## 2017-10-01 ENCOUNTER — Other Ambulatory Visit: Payer: Self-pay

## 2017-10-01 ENCOUNTER — Inpatient Hospital Stay
Admission: EM | Admit: 2017-10-01 | Discharge: 2017-10-03 | DRG: 805 | Disposition: A | Discharge status: Home / Self Care | Payer: Medicaid Other | Attending: Maternal Newborn | Admitting: Maternal Newborn

## 2017-10-01 DIAGNOSIS — F4325 Adjustment disorder with mixed disturbance of emotions and conduct: Secondary | ICD-10-CM | POA: Diagnosis present

## 2017-10-01 DIAGNOSIS — O42913 Preterm premature rupture of membranes, unspecified as to length of time between rupture and onset of labor, third trimester: Secondary | ICD-10-CM

## 2017-10-01 DIAGNOSIS — Z3A36 36 weeks gestation of pregnancy: Secondary | ICD-10-CM

## 2017-10-01 LAB — CBC
HEMATOCRIT: 37.9 % (ref 35.0–47.0)
HEMOGLOBIN: 13.2 g/dL (ref 12.0–16.0)
MCH: 31.1 pg (ref 26.0–34.0)
MCHC: 34.9 g/dL (ref 32.0–36.0)
MCV: 89.1 fL (ref 80.0–100.0)
Platelets: 141 10*3/uL — ABNORMAL LOW (ref 150–440)
RBC: 4.25 MIL/uL (ref 3.80–5.20)
RDW: 14 % (ref 11.5–14.5)
WBC: 8 10*3/uL (ref 3.6–11.0)

## 2017-10-01 LAB — CHLAMYDIA/NGC RT PCR (ARMC ONLY)
CHLAMYDIA TR: NOT DETECTED
N GONORRHOEAE: NOT DETECTED

## 2017-10-01 LAB — TYPE AND SCREEN
ABO/RH(D): A POS
ANTIBODY SCREEN: NEGATIVE

## 2017-10-01 MED ORDER — LIDOCAINE HCL (PF) 1 % IJ SOLN
INTRAMUSCULAR | Status: DC | PRN
  Administered 2017-10-01: 1 mL via INTRADERMAL

## 2017-10-01 MED ORDER — ONDANSETRON HCL 4 MG/2ML IJ SOLN: 4.0000 mg | Freq: Four times a day (QID) | INTRAMUSCULAR | Status: DC | PRN

## 2017-10-01 MED ORDER — LACTATED RINGERS IV SOLN: 500.0000 mL | Freq: Once | INTRAVENOUS | Status: DC

## 2017-10-01 MED ORDER — LIDOCAINE HCL (PF) 1 % IJ SOLN
INTRAMUSCULAR | Status: AC
  Filled 2017-10-01: qty 30

## 2017-10-01 MED ORDER — PHENYLEPHRINE 40 MCG/ML (10ML) SYRINGE FOR IV PUSH (FOR BLOOD PRESSURE SUPPORT): 80.0000 ug | PREFILLED_SYRINGE | INTRAVENOUS | Status: DC | PRN

## 2017-10-01 MED ORDER — LACTATED RINGERS IV SOLN
INTRAVENOUS | Status: DC
  Administered 2017-10-01: 1000 mL via INTRAVENOUS

## 2017-10-01 MED ORDER — ACETAMINOPHEN 325 MG PO TABS: 650.0000 mg | ORAL_TABLET | ORAL | Status: DC | PRN

## 2017-10-01 MED ORDER — FENTANYL CITRATE (PF) 100 MCG/2ML IJ SOLN: 50.0000 ug | INTRAMUSCULAR | Status: DC | PRN

## 2017-10-01 MED ORDER — PRENATAL MULTIVITAMIN CH
1.0000 | ORAL_TABLET | Freq: Every day | ORAL | Status: DC
  Administered 2017-10-02 – 2017-10-03 (×2): 1 via ORAL
  Filled 2017-10-01 (×2): qty 1

## 2017-10-01 MED ORDER — COCONUT OIL OIL: 1.0000 "application " | TOPICAL_OIL | Status: DC | PRN

## 2017-10-01 MED ORDER — HYDROCODONE-ACETAMINOPHEN 5-325 MG PO TABS: 1.0000 | ORAL_TABLET | Freq: Four times a day (QID) | ORAL | Status: DC | PRN

## 2017-10-01 MED ORDER — WITCH HAZEL-GLYCERIN EX PADS: 1.0000 "application " | MEDICATED_PAD | CUTANEOUS | Status: DC | PRN

## 2017-10-01 MED ORDER — OXYTOCIN 40 UNITS IN LACTATED RINGERS INFUSION - SIMPLE MED: 2.5000 [IU]/h | INTRAVENOUS | Status: DC

## 2017-10-01 MED ORDER — MISOPROSTOL 200 MCG PO TABS
ORAL_TABLET | ORAL | Status: AC
  Filled 2017-10-01: qty 4

## 2017-10-01 MED ORDER — FERROUS SULFATE 325 (65 FE) MG PO TABS
325.0000 mg | ORAL_TABLET | Freq: Two times a day (BID) | ORAL | Status: DC
  Administered 2017-10-01 – 2017-10-03 (×4): 325 mg via ORAL
  Filled 2017-10-01 (×4): qty 1

## 2017-10-01 MED ORDER — ONDANSETRON HCL 4 MG/2ML IJ SOLN: 4.0000 mg | INTRAMUSCULAR | Status: DC | PRN

## 2017-10-01 MED ORDER — AMPICILLIN SODIUM 2 G IJ SOLR
2.0000 g | Freq: Once | INTRAMUSCULAR | Status: AC
  Administered 2017-10-01: 2 g via INTRAVENOUS
  Filled 2017-10-01: qty 2000

## 2017-10-01 MED ORDER — DIPHENHYDRAMINE HCL 50 MG/ML IJ SOLN: 12.5000 mg | INTRAMUSCULAR | Status: DC | PRN

## 2017-10-01 MED ORDER — IBUPROFEN 600 MG PO TABS
600.0000 mg | ORAL_TABLET | Freq: Four times a day (QID) | ORAL | Status: DC
  Administered 2017-10-01 – 2017-10-03 (×8): 600 mg via ORAL
  Filled 2017-10-01 (×8): qty 1

## 2017-10-01 MED ORDER — OXYTOCIN BOLUS FROM INFUSION: 500.0000 mL | Freq: Once | INTRAVENOUS | Status: DC

## 2017-10-01 MED ORDER — EPHEDRINE 5 MG/ML INJ: 10.0000 mg | INTRAVENOUS | Status: DC | PRN

## 2017-10-01 MED ORDER — LIDOCAINE-EPINEPHRINE (PF) 1.5 %-1:200000 IJ SOLN
INTRAMUSCULAR | Status: DC | PRN
  Administered 2017-10-01: 3 mL via EPIDURAL

## 2017-10-01 MED ORDER — SODIUM CHLORIDE 0.9 % IV SOLN: 1.0000 g | Freq: Four times a day (QID) | INTRAVENOUS | Status: DC

## 2017-10-01 MED ORDER — ONDANSETRON HCL 4 MG PO TABS: 4.0000 mg | ORAL_TABLET | ORAL | Status: DC | PRN

## 2017-10-01 MED ORDER — DIPHENHYDRAMINE HCL 25 MG PO CAPS: 25.0000 mg | ORAL_CAPSULE | Freq: Four times a day (QID) | ORAL | Status: DC | PRN

## 2017-10-01 MED ORDER — LACTATED RINGERS IV SOLN
500.0000 mL | INTRAVENOUS | Status: DC | PRN
  Administered 2017-10-01: 500 mL via INTRAVENOUS

## 2017-10-01 MED ORDER — SENNOSIDES-DOCUSATE SODIUM 8.6-50 MG PO TABS
2.0000 | ORAL_TABLET | ORAL | Status: DC
  Administered 2017-10-01 – 2017-10-02 (×2): 2 via ORAL
  Filled 2017-10-01 (×2): qty 2

## 2017-10-01 MED ORDER — OXYTOCIN 40 UNITS IN LACTATED RINGERS INFUSION - SIMPLE MED
INTRAVENOUS | Status: AC
  Filled 2017-10-01: qty 1000

## 2017-10-01 MED ORDER — DIBUCAINE 1 % RE OINT
1.0000 "application " | TOPICAL_OINTMENT | RECTAL | Status: DC | PRN
  Filled 2017-10-01: qty 28

## 2017-10-01 MED ORDER — OXYTOCIN 10 UNIT/ML IJ SOLN
INTRAMUSCULAR | Status: AC
  Filled 2017-10-01: qty 2

## 2017-10-01 MED ORDER — BENZOCAINE-MENTHOL 20-0.5 % EX AERO
1.0000 "application " | INHALATION_SPRAY | CUTANEOUS | Status: DC | PRN
  Filled 2017-10-01: qty 56

## 2017-10-01 MED ORDER — FENTANYL 2.5 MCG/ML W/ROPIVACAINE 0.15% IN NS 100 ML EPIDURAL (ARMC)
EPIDURAL | Status: AC
  Filled 2017-10-01: qty 100

## 2017-10-01 MED ORDER — SODIUM CHLORIDE 0.9 % IV SOLN
INTRAVENOUS | Status: DC | PRN
  Administered 2017-10-01 (×2): 5 mL via EPIDURAL

## 2017-10-01 MED ORDER — FENTANYL 2.5 MCG/ML W/ROPIVACAINE 0.15% IN NS 100 ML EPIDURAL (ARMC)
12.0000 mL/h | EPIDURAL | Status: DC
  Administered 2017-10-01: 12 mL/h via EPIDURAL

## 2017-10-01 MED ORDER — SIMETHICONE 80 MG PO CHEW: 80.0000 mg | CHEWABLE_TABLET | ORAL | Status: DC | PRN

## 2017-10-01 MED ORDER — AMMONIA AROMATIC IN INHA
RESPIRATORY_TRACT | Status: AC
  Filled 2017-10-01: qty 10

## 2017-10-01 NOTE — OB Triage Note (Signed)
Possible SROM at 6am, EDD 10/24/2017, G2P1 CTX Every 3-5 mins Dilated 5cm

## 2017-10-01 NOTE — H&P (Addendum)
Obstetrics Admission History & Physical   Preterm labor, pPROM  HPI:  22 y.o. G2P1001 @ [redacted]w[redacted]d (10/24/2017, by Last Menstrual Period). Admitted on 10/01/2017:   Patient Active Problem List   Diagnosis Date Noted  . Preterm labor 10/01/2017  . 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 Substance induced mood disorder (HCC) 12/25/2016  . Alcohol abuse 12/25/2016    Presents for rupture of membranes at home around 0600, with a slow trickle of clear fluid. She is having painful contractions every 3-5 minutes and rates the intensity of her contractions as 8/10.  Prenatal care at: at [facility]. Pregnancy complicated by limited prenatal care, closely spaced pegnancy.  ROS: A review of systems was performed and negative, except as stated in the above HPI.  PMHx:  Past Medical History:  Diagnosis Date  . Kidney infection    PSHx: History reviewed. No pertinent surgical history. Medications:  Medications Prior to Admission  Medication Sig Dispense Refill Last Dose  . hydrocortisone 1 % ointment Apply 1 application topically 2 (two) times daily. 30 g 0    Allergies: has No Known Allergies. OBHx:  OB History  Gravida Para Term Preterm AB Living  SAB TAB Ectopic Multiple Live Births        0 1    # Outcome Date GA Lbr Len/2nd Weight Sex Delivery Anes PTL Lv  2 Current           1 Term 09/25/16 [redacted]w[redacted]d 05:04 / 00:20 7 lb 5.8 oz (3.34 kg) F Vag-Spont EPI  LIV   UJW:JXBJYNWG/NFAOZHYQMVHQ except as detailed in HPI.Marland Kitchen  No family history of birth defects. Soc Hx: Never smoker, Alcohol: former alcohol abuser and Recreational drug use: none  Objective:   Vitals:   10/01/17 0929 10/01/17 0932  BP:  120/64  Pulse:  74  SpO2: 100%    Constitutional: Well nourished, well developed female in no acute distress.  HEENT: normal Skin: Warm and dry.  Cardiovascular: Regular rate and rhythm.   Extremity: trace to 1+ bilateral pedal edema Respiratory:  Clear to auscultation bilaterally. Normal respiratory effort Abdomen: gravid, moderate tenderness Neuro: Cranial nerves grossly intact Psych: Alert and Oriented x3. No memory deficits. Normal mood and affect.  MS: normal gait, normal bilateral lower extremity ROM/strength/stability.  Pelvic exam: is not limited by body habitus External Genitalia, Bartholin's glands, Urethra, Skene's glands: within normal limits Vagina: within normal limits and with no blood in the vault Cervix: 5/90/-2 (RN exam) Uterus: Spontaneous uterine activity  Adnexa: not evaluated  EFM:FHR: 130 bpm, variability: moderate,  accelerations:  Present,  decelerations:  Absent Toco: Frequency: Every 3 minutes, Duration: 60-90 seconds, Intensity: Moderate intensity   Perinatal info:  Blood type: A positive Rubella - Immune Varicella - Immune TDaP not given during third trimester of this pregnancy RPR NR / HIV Neg/ HBsAg Neg   Assessment & Plan:   22 y.o. G2P1001 @ [redacted]w[redacted]d, Admitted on 10/01/2017 with preterm premature rupture of membranes and preterm labor.    Admit for labor, Antibiotics for GBS prophylaxis, Observe for cervical change, Fetal Wellbeing Reassuring and Epidural when ready   All discussed with patient, patient interviewed with assistance from video interpreter.  Marcelyn Bruins, CNM [facility], [facility] 10/01/2017  9:33 AM

## 2017-10-01 NOTE — Discharge Summary (Signed)
OB Discharge Summary     Patient Name: Erin Valentine DOB: 1995/09/13 MRN: [id number]  Date of admission: 10/01/2017 Delivering MD: Maryruth Eve, CNM  Date of Delivery: 10/01/2017  Date of discharge: 10/03/2017 Admitting diagnosis: 36 wks preg contractions and leaking fluid Intrauterine pregnancy: [redacted]w[redacted]d     Secondary diagnosis: Inadequate prenatal care     Discharge diagnosis: Preterm Pregnancy Delivered                                                                                                Post partum procedures:Depo Povera given prior to discharge on 10/03/2017  Augmentation: none  Complications: None  Hospital course:  Onset of Labor With Vaginal Delivery     22 y.o. yo G2P1001 at [redacted]w[redacted]d was admitted in Active Labor on 10/01/2017. Patient had an uncomplicated labor course as follows:  Membrane Rupture Time/Date: 1:05 PM ,10/01/2017   Intrapartum Procedures: Episiotomy: None [1]                                         Lacerations:  1st degree [2]  Patient had a delivery of a Viable female infant. 10/01/2017  Information for the patient's newborn:  Mallerie Blok, Boy Malori [[id number]]  Delivery Method: Vag-Spont    Pateint had an uncomplicated postpartum course.  She is ambulating, tolerating a regular diet, passing flatus, and urinating well. Patient is discharged home in stable condition on 10/03/17. Given discharge instructions with a interpretor present   Physical exam  Vitals:   10/02/17 0725 10/02/17 1554 10/02/17 2329 10/03/17 0732  BP: 111/60 107/64 108/70 95/63  Pulse: (!) 53 66 65 66  Resp: Temp: 98.4 F (36.9 C) 97.9 F (36.6 C) 97.9 F (36.6 C) 98 F (36.7 C)  TempSrc: Oral Oral Oral Oral  SpO2: 99% 97% 98% 99%   General: alert, cooperative and no distress. Complains of a scratchy throat and nasal congestion Lochia: appropriate Uterine Fundus: firm, U-1/ML/NT Incision: healing well with no evidence of hematoma or drainage DVT  Evaluation: No evidence of DVT seen on physical exam.  Labs: Lab Results  Component Value Date   WBC 8.0 10/02/2017   HGB 11.7 (L) 10/02/2017   HCT 33.1 (L) 10/02/2017   MCV 90.6 10/02/2017   PLT 130 (L) 10/02/2017   A POS/RI/ VI Discharge instruction: per After Visit Summary.  Medications:  Allergies as of 10/03/2017   No Known Allergies     Medication List    STOP taking these medications   hydrocortisone 1 % ointment     TAKE these medications   HYDROcodone-acetaminophen 5-325 MG tablet Commonly known as:  NORCO/VICODIN Take 1-2 tablets by mouth every 6 (six) hours as needed for up to 5 days (breakthrough pain).   ibuprofen 600 MG tablet Commonly known as:  ADVIL,MOTRIN Take 1 tablet (600 mg total) by mouth every 6 (six) hours as needed for mild pain, moderate pain or cramping.   prenatal multivitamin Tabs tablet Take 1 tablet by  mouth daily at 12 noon.       Diet: routine diet  Activity: Advance as tolerated. Pelvic rest for 6 weeks.   Outpatient follow up: Follow-up Information    Department, [facility] Follow up in 2 week(s).   Why:  postpartum depression check. Or can make appointment with [facility] for appointment in 2 weeks. Contact information: [address] [place] [address] [phone]             Postpartum contraception: Depo Provera Rhogam Given postpartum: no Rubella vaccine given postpartum: no Varicella vaccine given postpartum: no TDaP given antepartum or postpartum: last dose in 2018  Newborn Data: Live born female  Birth Weight: 6 lb 4.5 oz (2850 g) APGAR: 7,9  Newborn Delivery   Birth date/time:  10/01/2017 13:13:00 Delivery type:  Vaginal, Spontaneous      Baby Feeding: Bottle and Breast  Disposition:home with mother  SIGNED: Farrel Conners, CNM

## 2017-10-01 NOTE — Anesthesia Procedure Notes (Signed)
Epidural Patient location during procedure: OB Start time: 10/01/2017 9:19 AM End time: 10/01/2017 9:22 AM  Staffing Anesthesiologist: Piscitello, Cleda Mccreedy, MD Performed: anesthesiologist   Preanesthetic Checklist Completed: patient identified, site marked, surgical consent, pre-op evaluation, timeout performed, IV checked, risks and benefits discussed and monitors and equipment checked  Epidural Patient position: sitting Prep: ChloraPrep Patient monitoring: heart rate, continuous pulse ox and blood pressure Approach: midline Location: L3-L4 Injection technique: LOR saline  Needle:  Needle type: Tuohy  Needle gauge: 17 G Needle length: 9 cm and 9 Needle insertion depth: 5 cm Catheter type: closed end flexible Catheter size: 19 Gauge Catheter at skin depth: 10 cm Test dose: negative and 1.5% lidocaine with Epi 1:200 K  Assessment Sensory level: T10 Events: blood not aspirated, injection not painful, no injection resistance, negative IV test and no paresthesia  Additional Notes 1 attempt Pt. Evaluated and documentation done after procedure finished. Patient identified. Risks/Benefits/Options discussed with patient including but not limited to bleeding, infection, nerve damage, paralysis, failed block, incomplete pain control, headache, blood pressure changes, nausea, vomiting, reactions to medication both or allergic, itching and postpartum back pain. Confirmed with bedside nurse the patient's most recent platelet count. Confirmed with patient that they are not currently taking any anticoagulation, have any bleeding history or any family history of bleeding disorders. Patient expressed understanding and wished to proceed. All questions were answered. Sterile technique was used throughout the entire procedure. Please see nursing notes for vital signs. Test dose was given through epidural catheter and negative prior to continuing to dose epidural or start infusion. Warning signs of  high block given to the patient including shortness of breath, tingling/numbness in hands, complete motor block, or any concerning symptoms with instructions to call for help. Patient was given instructions on fall risk and not to get out of bed. All questions and concerns addressed with instructions to call with any issues or inadequate analgesia.   Patient tolerated the insertion well without immediate complications.Reason for block:procedure for pain

## 2017-10-01 NOTE — Anesthesia Preprocedure Evaluation (Signed)
Anesthesia Evaluation  Patient identified by MRN, date of birth, ID band Patient awake    Reviewed: Allergy & Precautions, H&P , NPO status , Patient's Chart, lab work & pertinent test results  History of Anesthesia Complications (+) history of anesthetic complications (patient reports that last epidural did not work)  Airway Mallampati: III  TM Distance: >3 FB Neck ROM: full    Dental  (+) Chipped, Poor Dentition Braces:   Pulmonary neg pulmonary ROS,           Cardiovascular Exercise Tolerance: Good (-) hypertensionnegative cardio ROS       Neuro/Psych PSYCHIATRIC DISORDERS    GI/Hepatic negative GI ROS,   Endo/Other    Renal/GU   negative genitourinary   Musculoskeletal   Abdominal   Peds  Hematology negative hematology ROS (+)   Anesthesia Other Findings Past Medical History: No date: Kidney infection  History reviewed. No pertinent surgical history.     Reproductive/Obstetrics (+) Pregnancy                             Anesthesia Physical Anesthesia Plan  ASA: II  Anesthesia Plan: Epidural   Post-op Pain Management:    Induction:   PONV Risk Score and Plan:   Airway Management Planned:   Additional Equipment:   Intra-op Plan:   Post-operative Plan:   Informed Consent: I have reviewed the patients History and Physical, chart, labs and discussed the procedure including the risks, benefits and alternatives for the proposed anesthesia with the patient or authorized representative who has indicated his/her understanding and acceptance.     Plan Discussed with: Anesthesiologist  Anesthesia Plan Comments: (Consent via interpreter )        Anesthesia Quick Evaluation

## 2017-10-02 DIAGNOSIS — O42913 Preterm premature rupture of membranes, unspecified as to length of time between rupture and onset of labor, third trimester: Secondary | ICD-10-CM

## 2017-10-02 LAB — CBC
HCT: 33.1 % — ABNORMAL LOW (ref 35.0–47.0)
Hemoglobin: 11.7 g/dL — ABNORMAL LOW (ref 12.0–16.0)
MCH: 31.9 pg (ref 26.0–34.0)
MCHC: 35.2 g/dL (ref 32.0–36.0)
MCV: 90.6 fL (ref 80.0–100.0)
PLATELETS: 130 10*3/uL — AB (ref 150–440)
RBC: 3.65 MIL/uL — AB (ref 3.80–5.20)
RDW: 14.2 % (ref 11.5–14.5)
WBC: 8 10*3/uL (ref 3.6–11.0)

## 2017-10-02 NOTE — Anesthesia Postprocedure Evaluation (Signed)
Anesthesia Post Note  Patient: Erin Valentine  Procedure(s) Performed: AN AD HOC LABOR EPIDURAL  Patient location during evaluation: Mother Baby Anesthesia Type: Epidural Level of consciousness: awake and alert Pain management: pain level controlled Vital Signs Assessment: post-procedure vital signs reviewed and stable Respiratory status: spontaneous breathing, nonlabored ventilation and respiratory function stable Cardiovascular status: stable Postop Assessment: no headache, no backache and epidural receding Anesthetic complications: no     Last Vitals:  Vitals:   10/02/17 0540 10/02/17 0725  BP: (!) 98/56 111/60  Pulse: (!) 59 (!) 53  Resp: 18 20  Temp: 36.4 C 36.9 C  SpO2: 99% 99%    Last Pain:  Vitals:   10/02/17 0725  TempSrc: Oral  PainSc:                  Starling Manns

## 2017-10-02 NOTE — Clinical Social Work Maternal (Signed)
CLINICAL SOCIAL WORK MATERNAL/CHILD NOTE  Patient Details  Name: Erin Valentine MRN: [id number] Date of Birth: 11/21/1995  Date:  10/02/2017  Clinical Social Worker Initiating Note:  York Spaniel MSW,LCSW Date/Time: Initiated:  10/02/17/      Child's Name:      Biological Parents:  Mother   Need for Interpreter:  None   Reason for Referral:  Other (Comment)(history of adjustment disorder and ETOH abuse)   Address:  [address] [place] [address]    Phone number:  [phone] (home)     Additional phone number: none  Household Members/Support Persons (HM/SP):       HM/SP Name Relationship DOB or Age  HM/SP -1        HM/SP -2        HM/SP -3        HM/SP -4        HM/SP -5        HM/SP -6        HM/SP -7        HM/SP -8          Natural Supports (not living in the home):  Other (Comment)(cousins)   Professional Supports: None   Employment:     Type of Work:     Education:      Homebound arranged:    Surveyor, quantity Resources:  Medicaid   Other Resources:  [employer]   Cultural/Religious Considerations Which May Impact Care:  none  Strengths:  Ability to meet basic needs , Home prepared for child    Psychotropic Medications:         Pediatrician:       Pediatrician List:   [employer] [place]    [place]      Pediatrician Fax Number:    Risk Factors/Current Problems:      Cognitive State:  Alert , Able to Concentrate    Mood/Affect:  Calm , Flat    CSW Assessment: CSW spoke with patient this afternoon while she was alone in her room. Patient's nurse stated that there had not been a need for an interpreter and that patient understood information provided to her thus far. Patient's nurse stated that patient has been caring appropriately for her newborn but patient has had a flat affect all day.  Upon entering patient's room, CSW asked patient if she felt as  though she needed an interpreter. Patient declined an interpreter. CSW introduced self and explained role and purpose of visit. Patient explained that in the home will be her, her newborn and her one year old and father of her children. Patient reports that she has all the necessities for her newborn. She has transportation. She denies history of a dx of adjustment disorder but does admit to cutting herself after last pregnancy and that this is why DSS was involved. She states DSS is no longer involved. Patient denies having any issues with depression or cutting during this pregnancy. CSW provided education regarding postpartum depression and explained the symptoms and encouraged her to call her doctor if she begins having symptoms. Patient did have a flat affect during assessment and smiled only a few times. A psych consult might need to be considered if patient's affect does not improve prior to discharge.  CSW Plan/Description:  Other Information/Referral to [employer], [place] 10/02/2017, 5:01 PM

## 2017-10-02 NOTE — Progress Notes (Signed)
Post Partum Day 1 Subjective: Doing well, no complaints.  Tolerating regular diet, pain with PO meds, voiding and ambulating without difficulty. She expresses that she is both breast and formula feeding but so far has not breast fed per RN report.   No CP SOB F/C N/V or leg pain No HA, change of vision, RUQ/epigastric pain  Objective: BP 111/60 (BP Location: Right Arm)   Pulse (!) 53   Temp 98.4 F (36.9 C) (Oral)   Resp 20   LMP 01/17/2017   SpO2 99%    Physical Exam:  General: NAD CV: RRR Pulm: nl effort, CTABL Lochia: moderate Uterine Fundus: fundus firm and below umbilicus DVT Evaluation: no cords, ttp LEs   Recent Labs    10/01/17 0830 10/02/17 0459  HGB 13.2 11.7*  HCT 37.9 33.1*  WBC 8.0 8.0  PLT 141* 130*    Assessment/Plan: 21 y.o. [id number] postpartum day # 1  1. Continue routine postpartum care 2. A positive, Rubella Immune, Varicella Immune 3. TDAP given antepartum 4. Formula (also plans breastfeeding) 5. Contraception: considering Depo 6. Social work consult: request has been made 7. Disposition: likely discharge to home tomorrow   Tresea Mall, CNM

## 2017-10-02 NOTE — Lactation Note (Signed)
This note was copied from a baby's chart. Mother states she would like to breastfeed as infant has been bottlefeeding but spitting up the formula.  She has not attempted in several feedings, denies any fullness or pain in her breasts.  Set up and explained use of a breast pump and had mom pre pump x 5  Mins.  Colostrum expressing easily with pump.  Awakened infant and placed him skin to skin with mom in football hold.  Infant will latch but even with stimulation, will only do a minimal amount of sucking and no swallowing noted at the breast despite hand expression and milk expressing easily.  Switched to cradle hold on left breast, latches but unable to sustain any rhythmic sucking or swallowing.  Had mom pump x 15 minutes, syringe fed 5ml of colostrum easily.

## 2017-10-03 LAB — CULTURE, BETA STREP (GROUP B ONLY)

## 2017-10-03 LAB — RPR: RPR: NONREACTIVE

## 2017-10-03 MED ORDER — HYDROCODONE-ACETAMINOPHEN 5-325 MG PO TABS: 1.0000 | ORAL_TABLET | Freq: Four times a day (QID) | ORAL | 0 refills | Status: AC | PRN

## 2017-10-03 MED ORDER — LORATADINE 10 MG PO TABS
10.0000 mg | ORAL_TABLET | Freq: Every day | ORAL | Status: DC
  Administered 2017-10-03: 10 mg via ORAL
  Filled 2017-10-03: qty 1

## 2017-10-03 MED ORDER — MEDROXYPROGESTERONE ACETATE 150 MG/ML IM SUSP
150.0000 mg | Freq: Once | INTRAMUSCULAR | Status: AC
  Administered 2017-10-03: 150 mg via INTRAMUSCULAR
  Filled 2017-10-03: qty 1

## 2017-10-03 MED ORDER — PRENATAL MULTIVITAMIN CH: 1.0000 | ORAL_TABLET | Freq: Every day | ORAL | 11 refills | Status: DC

## 2017-10-03 MED ORDER — MENTHOL 3 MG MT LOZG
1.0000 | LOZENGE | OROMUCOSAL | Status: DC | PRN
  Filled 2017-10-03: qty 9

## 2017-10-03 MED ORDER — IBUPROFEN 600 MG PO TABS: 600.0000 mg | ORAL_TABLET | Freq: Four times a day (QID) | ORAL | 1 refills | Status: DC | PRN

## 2017-10-03 NOTE — Progress Notes (Signed)
Dc inst reviewed with pt.  Verb u/o 

## 2017-10-03 NOTE — Progress Notes (Signed)
dC to home to car via auxillary.  Rx given to pt

## 2017-10-27 ENCOUNTER — Other Ambulatory Visit: Payer: Self-pay

## 2017-10-27 ENCOUNTER — Encounter: Payer: Self-pay | Admitting: Emergency Medicine

## 2017-10-27 ENCOUNTER — Emergency Department
Admission: EM | Admit: 2017-10-27 | Discharge: 2017-10-27 | Disposition: A | Discharge status: Home / Self Care | Payer: Medicaid Other | Attending: Emergency Medicine | Admitting: Emergency Medicine

## 2017-10-27 DIAGNOSIS — H9202 Otalgia, left ear: Secondary | ICD-10-CM | POA: Diagnosis present

## 2017-10-27 DIAGNOSIS — Z79899 Other long term (current) drug therapy: Secondary | ICD-10-CM | POA: Insufficient documentation

## 2017-10-27 DIAGNOSIS — H66002 Acute suppurative otitis media without spontaneous rupture of ear drum, left ear: Secondary | ICD-10-CM | POA: Diagnosis not present

## 2017-10-27 MED ORDER — AMOXICILLIN 500 MG PO CAPS: 1000.0000 mg | ORAL_CAPSULE | Freq: Three times a day (TID) | ORAL | 0 refills | Status: AC

## 2017-10-27 MED ORDER — AMOXICILLIN 500 MG PO CAPS
1000.0000 mg | ORAL_CAPSULE | Freq: Once | ORAL | Status: AC
  Administered 2017-10-27: 1000 mg via ORAL
  Filled 2017-10-27: qty 2

## 2017-10-27 NOTE — Discharge Instructions (Addendum)
Usted est siendo tratado por una infeccin de odo. Tome el antibitico segn las indicaciones. Haga un seguimiento con [facility] o regrese al departamento de emergencias para Administrator, artsempeorar el dolor o el drenaje.  You are being treated for an ear infection. Take the antibiotic as directed. [facility] with [facility] or return to the emergency department for worsening pain or drainage.

## 2017-10-27 NOTE — ED Triage Notes (Addendum)
Patient complains of left ear pain that started today with clear drainage. Patient denies any cough or congestion. Interpreter used for triage.

## 2017-10-27 NOTE — ED Provider Notes (Signed)
[facility] Emergency Department Provider Note ____________________________________________  Time seen: 2243  I have reviewed the triage vital signs and the nursing notes.  HISTORY  Chief Complaint  Otalgia  History limited by Spanish language.  Interpreter [name] present during interview and exam.  HPI Erin Valentine is a 22 y.o. female presents herself to the ED for evaluation of left ear pain.  She describes onset of sudden left ear pain, fullness, and a clear drainage from the ear.  Patient denies any direct trauma to the ear.  She also denies any dizziness, nausea, vomiting, or vertigo.  She denies any cough or sinus congestion.  She also denies any water in the ear due to swimming and/or showering.  Past Medical History:  Diagnosis Date  . Kidney infection     Patient Active Problem List   Diagnosis Date Noted  . Preterm labor in third trimester with preterm delivery 10/01/2017  . Postpartum care following vaginal delivery 10/01/2017  . 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 Substance induced mood disorder (HCC) 12/25/2016  . Alcohol abuse 12/25/2016    History reviewed. No pertinent surgical history.  Prior to Admission medications   Medication Sig Start Date End Date Taking? Authorizing Provider  amoxicillin (AMOXIL) 500 MG capsule Take 2 capsules (1,000 mg total) by mouth 3 (three) times daily for 5 days. 10/27/17 11/01/17  Wacey Zieger, Charlesetta Ivory, PA-C  ibuprofen (ADVIL,MOTRIN) 600 MG tablet Take 1 tablet (600 mg total) by mouth every 6 (six) hours as needed for mild pain, moderate pain or cramping. 10/03/17   Farrel Conners, CNM  Prenatal Vit-Fe Fumarate-FA (PRENATAL MULTIVITAMIN) TABS tablet Take 1 tablet by mouth daily at 12 noon. 10/03/17   Farrel Conners, CNM    Allergies Patient has no known allergies.  No family history on file.  Social History Social History   Tobacco Use   . Smoking status: Never Smoker  . Smokeless tobacco: Never Used  Substance Use Topics  . Alcohol use: No  . Drug use: No    Review of Systems  Constitutional: Negative for fever. Eyes: Negative for visual changes. ENT: Negative for sore throat.  Left ear pain as above. Cardiovascular: Negative for chest pain. Respiratory: Negative for shortness of breath. Gastrointestinal: Negative for abdominal pain, vomiting and diarrhea. Skin: Negative for rash. Neurological: Negative for headaches, focal weakness or numbness. ____________________________________________  PHYSICAL EXAM:  VITAL SIGNS: ED Triage Vitals  Enc Vitals Group     BP 10/27/17 2209 116/79     Pulse Rate 10/27/17 2209 (!) 48     Resp 10/27/17 2209 18     Temp 10/27/17 2209 98.7 F (37.1 C)     Temp Source 10/27/17 2209 Oral     SpO2 10/27/17 2209 100 %     Weight 10/27/17 2210 132 lb (59.9 kg)     Height 10/27/17 2210 5\' 4"  (1.626 m)     Head Circumference --      Peak Flow --      Pain Score 10/27/17 2209 9     Pain Loc --      Pain Edu? --      Excl. in GC? --     Constitutional: Alert and oriented. Well appearing and in no distress. Head: Normocephalic and atraumatic. Eyes: Conjunctivae are normal. PERRL. Normal extraocular movements Ears: Canals clear bilaterally. TMs intact bilaterally.  Left TM is injected, bulging, and a purulent effusion is noted.  There is  serous fluid noted in the ear canal anterior to the inferior portion of the TM Nose: No congestion/rhinorrhea/epistaxis. Hematological/Lymphatic/Immunological: No cervical lymphadenopathy. Cardiovascular: Normal rate, regular rhythm. Normal distal pulses. Respiratory: Normal respiratory effort.  Musculoskeletal: Nontender with normal range of motion in all extremities.  Neurologic:  Normal gait without ataxia. Normal speech and language. No gross focal neurologic deficits are appreciated. Skin:  Skin is warm, dry and intact. No rash  noted. ____________________________________________  PROCEDURES  Procedures Amoxil 1000 mg PO ____________________________________________  INITIAL IMPRESSION / ASSESSMENT AND PLAN / ED COURSE  ED evaluation of sudden left ear pain and decreased hearing.  Patient's exam confirms an acute otitis media on the left.  No spontaneous rupture of the drum is appreciated.  Should be discharged with a prescription for amoxicillin to dose as directed.  [facility] or return to the ED as needed. ____________________________________________  FINAL CLINICAL IMPRESSION(S) / ED DIAGNOSES  Final diagnoses:  Acute suppurative otitis media of left ear without spontaneous rupture of tympanic membrane, recurrence not specified      Lissa HoardMenshew, Chavis Tessler V Bacon, PA-C 10/27/17 2322    Minna AntisPaduchowski, Kevin, MD 10/28/17 2153

## 2018-05-14 ENCOUNTER — Other Ambulatory Visit: Payer: Self-pay

## 2018-05-14 ENCOUNTER — Encounter: Payer: Self-pay | Admitting: Emergency Medicine

## 2018-05-14 ENCOUNTER — Emergency Department
Admission: EM | Admit: 2018-05-14 | Discharge: 2018-05-14 | Disposition: A | Discharge status: Home / Self Care | Payer: Medicaid Other | Attending: Emergency Medicine | Admitting: Emergency Medicine

## 2018-05-14 DIAGNOSIS — F4325 Adjustment disorder with mixed disturbance of emotions and conduct: Secondary | ICD-10-CM | POA: Diagnosis not present

## 2018-05-14 DIAGNOSIS — H73012 Bullous myringitis, left ear: Secondary | ICD-10-CM | POA: Diagnosis not present

## 2018-05-14 DIAGNOSIS — L309 Dermatitis, unspecified: Secondary | ICD-10-CM

## 2018-05-14 DIAGNOSIS — F1010 Alcohol abuse, uncomplicated: Secondary | ICD-10-CM | POA: Diagnosis not present

## 2018-05-14 DIAGNOSIS — Z79899 Other long term (current) drug therapy: Secondary | ICD-10-CM | POA: Insufficient documentation

## 2018-05-14 DIAGNOSIS — L259 Unspecified contact dermatitis, unspecified cause: Secondary | ICD-10-CM | POA: Insufficient documentation

## 2018-05-14 DIAGNOSIS — H9202 Otalgia, left ear: Secondary | ICD-10-CM | POA: Diagnosis present

## 2018-05-14 MED ORDER — AMOXICILLIN-POT CLAVULANATE 875-125 MG PO TABS: 1.0000 | ORAL_TABLET | Freq: Two times a day (BID) | ORAL | 0 refills | Status: DC

## 2018-05-14 MED ORDER — TRIAMCINOLONE ACETONIDE 0.5 % EX OINT: 1.0000 "application " | TOPICAL_OINTMENT | Freq: Two times a day (BID) | CUTANEOUS | 0 refills | Status: DC

## 2018-05-14 MED ORDER — NAPROXEN 500 MG PO TABS: 500.0000 mg | ORAL_TABLET | Freq: Two times a day (BID) | ORAL | 0 refills | Status: DC

## 2018-05-14 MED ORDER — CIPROFLOXACIN HCL 0.3 % OP SOLN: 2.0000 [drp] | OPHTHALMIC | 0 refills | Status: AC

## 2018-05-14 NOTE — Discharge Instructions (Signed)
See the ENT specialist if not improving over the week.  Return to the ER for symptoms that change or worsen if unable to see primary care or the specialist.

## 2018-05-14 NOTE — ED Notes (Signed)
See triage note   Presents with left ear pain   Pain started couple of days ago  No fever

## 2018-05-14 NOTE — ED Triage Notes (Signed)
Left ear pain 1 week. 

## 2018-05-15 NOTE — ED Provider Notes (Signed)
[facility] Emergency Department Provider Note ____________________________________________  Time seen: Approximately 12:24 AM  I have reviewed the triage vital signs and the nursing notes.   HISTORY  Chief Complaint Otalgia    HPI Rakira Bentham is a 23 y.o. female who presents to the emergency department for treatment and evaluation of left ear pain that started about a week ago.  No known fever.  She has decreased ability to hear in the left ear.  Past Medical History:  Diagnosis Date  . Kidney infection     Patient Active Problem List   Diagnosis Date Noted  . Preterm labor in third trimester with preterm delivery 10/01/2017  . Postpartum care following vaginal delivery 10/01/2017  . 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 Substance induced mood disorder (HCC) 12/25/2016  . Alcohol abuse 12/25/2016    History reviewed. No pertinent surgical history.  Prior to Admission medications   Medication Sig Start Date End Date Taking? Authorizing Provider  amoxicillin-clavulanate (AUGMENTIN) 875-125 MG tablet Take 1 tablet by mouth 2 (two) times daily. 05/14/18   Mehmet Scally, Rulon Eisenmenger B, FNP  ciprofloxacin (CILOXAN) 0.3 % ophthalmic solution Place 2 drops into the left ear every 4 (four) hours while awake for 7 days. 05/14/18 05/21/18  Jaylea Plourde, Kasandra Knudsen, FNP  ibuprofen (ADVIL,MOTRIN) 600 MG tablet Take 1 tablet (600 mg total) by mouth every 6 (six) hours as needed for mild pain, moderate pain or cramping. 10/03/17   Farrel Conners, CNM  naproxen (NAPROSYN) 500 MG tablet Take 1 tablet (500 mg total) by mouth 2 (two) times daily with a meal. 05/14/18   Kalecia Hartney B, FNP  Prenatal Vit-Fe Fumarate-FA (PRENATAL MULTIVITAMIN) TABS tablet Take 1 tablet by mouth daily at 12 noon. 10/03/17   Farrel Conners, CNM  triamcinolone ointment (KENALOG) 0.5 % Apply 1 application topically 2 (two) times daily. 05/14/18   Chinita Pester, FNP    Allergies Patient has no known allergies.  No family history on file.  Social History Social History   Tobacco Use  . Smoking status: Never Smoker  . Smokeless tobacco: Never Used  Substance Use Topics  . Alcohol use: No  . Drug use: No    Review of Systems Constitutional: Negative for fever.  Positive for decreased ability to hear from left ear(s). Eyes: Negative for discharge or drainage. ENT:       Positive for otalgia in left ear(s).      Positive for drainage from the left ear.      Negative for rhinorrhea or congestion.      Negative for sore throat. Gastrointestinal: Negative for nausea, vomiting, or diarrhea. Musculoskeletal: Negative for myalgias. Skin: Negative for rash, lesions, or wounds. Neurological: Negative for paresthesias. ____________________________________________   PHYSICAL EXAM:  VITAL SIGNS: ED Triage Vitals  Enc Vitals Group     BP 05/14/18 1707 (!) 112/58     Pulse Rate 05/14/18 1707 65     Resp 05/14/18 1707 18     Temp 05/14/18 1707 98 F (36.7 C)     Temp Source 05/14/18 1707 Oral     SpO2 05/14/18 1707 100 %     Weight 05/14/18 1707 140 lb (63.5 kg)     Height 05/14/18 1707 5\' 4"  (1.626 m)     Head Circumference --      Peak Flow --      Pain Score 05/14/18 1710 6     Pain Loc --  Pain Edu? --      Excl. in GC? --     Constitutional: Uncomfortable appearing. Eyes: Conjunctivae are clear without discharge or drainage. Ears:       Right TM: Normal.      Left TM: Vesicles on the external surface of the tympanic membrane that does not clear with sterile swab and mucopurulent discharge in the EAC  head: Atraumatic. Nose: No rhinorrhea or sinus pain on percussion. Mouth/Throat: Oropharynx normal. Tonsils normal without exudate. Hematological/Lymphatic/Immunilogical: No palpable anterior cervical lymphadenopathy. Cardiovascular: Heart rate and rhythm are regular without murmur, gallop, or rub  appreciated. Respiratory: Breath sounds are clear throughout to auscultation.  Neurologic:  Alert and oriented x 4. Skin: Bilateral hands erythematous with scaly plaque-like skin. ____________________________________________   LABS (all labs ordered are listed, but only abnormal results are displayed)  Labs Reviewed - No data to display ____________________________________________   RADIOLOGY  Not indicated ____________________________________________   PROCEDURES  Procedure(s) performed:   Procedures  ____________________________________________   INITIAL IMPRESSION / ASSESSMENT AND PLAN / ED COURSE  23 year old female presenting to the emergency department for treatment and evaluation of left ear pain and treatment of bilateral rash to hands that is been present on and off for several years.  She will be treated with Augmentin and ciprofloxacin drops for the ear.  She states that she has tried fungal cream for the hands without any improvement.  She will be given a prescription for triamcinolone and advised to use twice a day.  She was instructed to follow-up with primary care for symptoms are not improving over the next few days.  She was instructed to return to the emergency department for symptoms of change or worsen if she is unable to schedule appointment.  Pertinent labs & imaging results that were available during my care of the patient were reviewed by me and considered in my medical decision making (see chart for details). ____________________________________________   FINAL CLINICAL IMPRESSION(S) / ED DIAGNOSES  Final diagnoses:  Bullous myringitis of left ear  Eczema, unspecified type    ED Discharge Orders         Ordered    amoxicillin-clavulanate (AUGMENTIN) 875-125 MG tablet  2 times daily     05/14/18 1814    ciprofloxacin (CILOXAN) 0.3 % ophthalmic solution  Every 4 hours while awake     05/14/18 1814    naproxen (NAPROSYN) 500 MG tablet  2 times  daily with meals     05/14/18 1814    triamcinolone ointment (KENALOG) 0.5 %  2 times daily     05/14/18 1824          If controlled substance prescribed during this visit, 12 month history viewed on the [facility] prior to issuing an initial prescription for Schedule II or III opiod.   Note:  This document was prepared using Dragon voice recognition software and may include unintentional dictation errors.     Chinita Pester, FNP 05/15/18 0626    Dionne Bucy, MD 05/15/18 1551

## 2019-04-08 ENCOUNTER — Other Ambulatory Visit: Payer: Self-pay

## 2019-04-08 ENCOUNTER — Encounter: Payer: Self-pay | Admitting: Emergency Medicine

## 2019-04-08 DIAGNOSIS — N390 Urinary tract infection, site not specified: Secondary | ICD-10-CM | POA: Diagnosis not present

## 2019-04-08 DIAGNOSIS — Z79899 Other long term (current) drug therapy: Secondary | ICD-10-CM | POA: Diagnosis not present

## 2019-04-08 DIAGNOSIS — R1031 Right lower quadrant pain: Secondary | ICD-10-CM | POA: Diagnosis present

## 2019-04-08 LAB — CBC WITH DIFFERENTIAL/PLATELET
Abs Immature Granulocytes: 0.03 10*3/uL (ref 0.00–0.07)
Basophils Absolute: 0 10*3/uL (ref 0.0–0.1)
Basophils Relative: 0 %
Eosinophils Absolute: 0.2 10*3/uL (ref 0.0–0.5)
Eosinophils Relative: 1 %
HCT: 39.9 % (ref 36.0–46.0)
Hemoglobin: 13.4 g/dL (ref 12.0–15.0)
Immature Granulocytes: 0 %
Lymphocytes Relative: 25 %
Lymphs Abs: 3.1 10*3/uL (ref 0.7–4.0)
MCH: 29.6 pg (ref 26.0–34.0)
MCHC: 33.6 g/dL (ref 30.0–36.0)
MCV: 88.1 fL (ref 80.0–100.0)
Monocytes Absolute: 0.6 10*3/uL (ref 0.1–1.0)
Monocytes Relative: 5 %
Neutro Abs: 8.4 10*3/uL — ABNORMAL HIGH (ref 1.7–7.7)
Neutrophils Relative %: 69 %
Platelets: 217 10*3/uL (ref 150–400)
RBC: 4.53 MIL/uL (ref 3.87–5.11)
RDW: 12.3 % (ref 11.5–15.5)
WBC: 12.3 10*3/uL — ABNORMAL HIGH (ref 4.0–10.5)
nRBC: 0 % (ref 0.0–0.2)

## 2019-04-08 LAB — URINALYSIS, COMPLETE (UACMP) WITH MICROSCOPIC
Bilirubin Urine: NEGATIVE
Glucose, UA: NEGATIVE mg/dL
Ketones, ur: NEGATIVE mg/dL
Nitrite: POSITIVE — AB
Protein, ur: 100 mg/dL — AB
Specific Gravity, Urine: 1.02 (ref 1.005–1.030)
WBC, UA: >50 WBC/hpf — ABNORMAL HIGH (ref 0–5)
pH: 6 (ref 5.0–8.0)

## 2019-04-08 LAB — COMPREHENSIVE METABOLIC PANEL
ALT: 15 U/L (ref 0–44)
AST: 18 U/L (ref 15–41)
Albumin: 4.4 g/dL (ref 3.5–5.0)
Alkaline Phosphatase: 47 U/L (ref 38–126)
Anion gap: 8 (ref 5–15)
BUN: 14 mg/dL (ref 6–20)
CO2: 26 mmol/L (ref 22–32)
Calcium: 9.2 mg/dL (ref 8.9–10.3)
Chloride: 104 mmol/L (ref 98–111)
Creatinine, Ser: 0.55 mg/dL (ref 0.44–1.00)
GFR calc Af Amer: >60 mL/min (ref >60)
GFR calc non Af Amer: >60 mL/min (ref >60)
Glucose, Bld: 91 mg/dL (ref 70–99)
Potassium: 3.5 mmol/L (ref 3.5–5.1)
Sodium: 138 mmol/L (ref 135–145)
Total Bilirubin: 0.6 mg/dL (ref 0.3–1.2)
Total Protein: 7.4 g/dL (ref 6.5–8.1)

## 2019-04-08 LAB — POCT PREGNANCY, URINE: Preg Test, Ur: NEGATIVE

## 2019-04-08 NOTE — ED Triage Notes (Signed)
Patient ambulatory to triage with steady gait, without difficulty or distress noted, mask in place; pt reports rt flank pain radiating into abd accomp by diff urinating today

## 2019-04-09 ENCOUNTER — Emergency Department
Admission: EM | Admit: 2019-04-09 | Discharge: 2019-04-09 | Disposition: A | Discharge status: Home / Self Care | Payer: Medicaid Other | Attending: Emergency Medicine | Admitting: Emergency Medicine

## 2019-04-09 DIAGNOSIS — R109 Unspecified abdominal pain: Secondary | ICD-10-CM

## 2019-04-09 DIAGNOSIS — N390 Urinary tract infection, site not specified: Secondary | ICD-10-CM

## 2019-04-09 MED ORDER — CEPHALEXIN 500 MG PO CAPS
500.0000 mg | ORAL_CAPSULE | Freq: Three times a day (TID) | ORAL | 0 refills | Status: DC
Start: 2019-04-09 — End: 2020-01-21

## 2019-04-09 MED ORDER — SODIUM CHLORIDE 0.9 % IV SOLN
1.0000 g | Freq: Once | INTRAVENOUS | Status: AC
  Administered 2019-04-09: 01:00:00 1 g via INTRAVENOUS
  Filled 2019-04-09: qty 10

## 2019-04-09 NOTE — Discharge Instructions (Signed)
1.  Take antibiotic as prescribed (Keflex 500 mg 3 times daily x7 days). 2.  Return to the ER for worsening symptoms, persistent vomiting, fever or other concerns.

## 2019-04-09 NOTE — ED Provider Notes (Signed)
[facility] Emergency Department Provider Note   ____________________________________________   First MD Initiated Contact with Patient 04/09/19 [phone]     (approximate)  I have reviewed the triage vital signs and the nursing notes.   HISTORY  Chief Complaint Flank Pain    HPI Erin Valentine is a 23 y.o. female who presents to the ED from home with a chief complaint of right flank and suprapubic abdominal pain with dysuria.  Patient reports a 2-day history of symptoms.  Similar symptoms previously with pyelonephritis.  No history of kidney stones.  Denies fever, cough, chest pain, shortness of breath, nausea, vomiting.  Denies recent travel or trauma.       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 No pertinent surgical history.  Prior to Admission medications   Medication Sig Start Date End Date Taking? Authorizing Provider  amoxicillin-clavulanate (AUGMENTIN) 875-125 MG tablet Take 1 tablet by mouth 2 (two) times daily. 05/14/18   Triplett, Cari B, FNP  cephALEXin (KEFLEX) 500 MG capsule Take 1 capsule (500 mg total) by mouth 3 (three) times daily. 04/09/19   Irean Hong, MD  ibuprofen (ADVIL,MOTRIN) 600 MG tablet Take 1 tablet (600 mg total) by mouth every 6 (six) hours as needed for mild pain, moderate pain or cramping. 10/03/17   Farrel Conners, CNM  naproxen (NAPROSYN) 500 MG tablet Take 1 tablet (500 mg total) by mouth 2 (two) times daily with a meal. 05/14/18   Triplett, Cari B, FNP  Prenatal Vit-Fe Fumarate-FA (PRENATAL MULTIVITAMIN) TABS tablet Take 1 tablet  by mouth daily at 12 noon. 10/03/17   Farrel Conners, CNM  triamcinolone ointment (KENALOG) 0.5 % Apply 1 application topically 2 (two) times daily. 05/14/18   Chinita Pester, FNP    Allergies Patient has no known allergies.  No family history on file.  Social History Social History   Tobacco Use   Smoking status: Never Smoker   Smokeless tobacco: Never Used  Substance Use Topics   Alcohol use: No   Drug use: No    Review of Systems  Constitutional: No fever/chills Eyes: No visual changes. ENT: No sore throat. Cardiovascular: Denies chest pain. Respiratory: Denies shortness of breath. Gastrointestinal: Positive for right flank and suprapubic abdominal pain.  No nausea, no vomiting.  No diarrhea.  No constipation. Genitourinary: Negative for dysuria. Musculoskeletal: Negative for back pain. Skin: Negative for rash. Neurological: Negative for headaches, focal weakness or numbness.   ____________________________________________   PHYSICAL EXAM:  VITAL SIGNS: ED Triage Vitals [04/08/19 2326]  Enc Vitals Group     BP 104/83     Pulse Rate 60     Resp 18     Temp 98.6 F (37 C)     Temp Source Oral     SpO2      Weight 120 lb (54.4 kg)     Height 5\' 4"  (1.626 m)     Head Circumference      Peak Flow      Pain Score 9     Pain Loc      Pain Edu?  Excl. in GC?     Constitutional: Alert and oriented. Well appearing and in no acute distress.  Texting on cell phone. Eyes: Conjunctivae are normal. PERRL. EOMI. Head: Atraumatic. Nose: No congestion/rhinnorhea. Mouth/Throat: Mucous membranes are moist.  Oropharynx non-erythematous. Neck: No stridor.   Cardiovascular: Normal rate, regular rhythm. Grossly normal heart sounds.  Good peripheral circulation. Respiratory: Normal respiratory effort.  No retractions. Lungs CTAB. Gastrointestinal: Soft and minimally tender to palpation suprapubic area without rebound or guarding. No distention. No abdominal  bruits. No CVA tenderness. Musculoskeletal: No lower extremity tenderness nor edema.  No joint effusions. Neurologic:  Normal speech and language. No gross focal neurologic deficits are appreciated. No gait instability. Skin:  Skin is warm, dry and intact. No rash noted. Psychiatric: Mood and affect are normal. Speech and behavior are normal.  ____________________________________________   LABS (all labs ordered are listed, but only abnormal results are displayed)  Labs Reviewed  CBC WITH DIFFERENTIAL/PLATELET - Abnormal; Notable for the following components:      Result Value   WBC 12.3 (*)    Neutro Abs 8.4 (*)    All other components within normal limits  URINALYSIS, COMPLETE (UACMP) WITH MICROSCOPIC - Abnormal; Notable for the following components:   Color, Urine YELLOW (*)    APPearance CLOUDY (*)    Hgb urine dipstick SMALL (*)    Protein, ur 100 (*)    Nitrite POSITIVE (*)    Leukocytes,Ua LARGE (*)    WBC, UA >50 (*)    Bacteria, UA MANY (*)    All other components within normal limits  URINE CULTURE  COMPREHENSIVE METABOLIC PANEL  POCT PREGNANCY, URINE   ____________________________________________  EKG  None ____________________________________________  RADIOLOGY  ED MD interpretation: None  Official radiology report(s): No results found.  ____________________________________________   PROCEDURES  Procedure(s) performed (including Critical Care):  Procedures   ____________________________________________   INITIAL IMPRESSION / ASSESSMENT AND PLAN / ED COURSE  As part of my medical decision making, I reviewed the following data within the electronic MEDICAL RECORD NUMBER Nursing notes reviewed and incorporated, Labs reviewed, Old chart reviewed and Notes from prior ED visits     Erin Valentine was evaluated in Emergency Department on 04/09/2019 for the symptoms described in the history of present illness. She was evaluated in the context of  the global COVID-19 pandemic, which necessitated consideration that the patient might be at risk for infection with the SARS-CoV-2 virus that causes COVID-19. Institutional protocols and algorithms that pertain to the evaluation of patients at risk for COVID-19 are in a state of rapid change based on information released by regulatory bodies including the CDC and federal and state organizations. These policies and algorithms were followed during the patient's care in the ED.    23 year old female who presents with right flank and suprapubic abdominal pain. Differential diagnosis includes, but is not limited to, ovarian cyst, ovarian torsion, acute appendicitis, diverticulitis, urinary tract infection/pyelonephritis, endometriosis, bowel obstruction, colitis, renal colic, gastroenteritis, hernia, fibroids, endometriosis, pregnancy related pain including ectopic pregnancy, etc.  Laboratory results remarkable for mild leukocytosis and UTI.  Patient has no history of kidney stones; I personally reviewed her CT scan in 2017 which did not reveal stones.  Patient is not having fever or vomiting; low suspicion for pyelonephritis.  Will add urine culture, administer IV Rocephin and discharged home on Keflex.  Strict return precautions given.  Patient verbalizes understanding and agrees with plan of care.      ____________________________________________  FINAL CLINICAL IMPRESSION(S) / ED DIAGNOSES  Final diagnoses:  Right flank pain  Lower urinary tract infectious disease     ED Discharge Orders         Ordered    cephALEXin (KEFLEX) 500 MG capsule  3 times daily     04/09/19 0059           Note:  This document was prepared using Dragon voice recognition software and may include unintentional dictation errors.   Paulette Blanch, MD 04/09/19 [phone]

## 2019-04-11 LAB — URINE CULTURE: Culture: >=100000 — AB

## 2019-04-18 DIAGNOSIS — Z915 Personal history of self-harm: Secondary | ICD-10-CM

## 2019-04-18 DIAGNOSIS — IMO0002 Reserved for concepts with insufficient information to code with codable children: Secondary | ICD-10-CM

## 2019-04-21 ENCOUNTER — Ambulatory Visit: Payer: Self-pay

## 2019-10-09 ENCOUNTER — Ambulatory Visit: Payer: Medicaid Other

## 2019-11-04 ENCOUNTER — Encounter: Payer: Self-pay | Admitting: Emergency Medicine

## 2019-11-04 ENCOUNTER — Emergency Department
Admission: EM | Admit: 2019-11-04 | Discharge: 2019-11-04 | Disposition: A | Discharge status: Home / Self Care | Payer: Medicaid Other | Attending: Emergency Medicine | Admitting: Emergency Medicine

## 2019-11-04 ENCOUNTER — Emergency Department: Payer: Medicaid Other

## 2019-11-04 ENCOUNTER — Other Ambulatory Visit: Payer: Self-pay

## 2019-11-04 DIAGNOSIS — Z3202 Encounter for pregnancy test, result negative: Secondary | ICD-10-CM | POA: Diagnosis not present

## 2019-11-04 DIAGNOSIS — R300 Dysuria: Secondary | ICD-10-CM | POA: Insufficient documentation

## 2019-11-04 DIAGNOSIS — R109 Unspecified abdominal pain: Secondary | ICD-10-CM

## 2019-11-04 DIAGNOSIS — N3001 Acute cystitis with hematuria: Secondary | ICD-10-CM | POA: Diagnosis not present

## 2019-11-04 DIAGNOSIS — R319 Hematuria, unspecified: Secondary | ICD-10-CM

## 2019-11-04 LAB — BASIC METABOLIC PANEL
Anion gap: 11 (ref 5–15)
BUN: 14 mg/dL (ref 6–20)
CO2: 25 mmol/L (ref 22–32)
Calcium: 9 mg/dL (ref 8.9–10.3)
Chloride: 102 mmol/L (ref 98–111)
Creatinine, Ser: 0.62 mg/dL (ref 0.44–1.00)
GFR calc Af Amer: >60 mL/min (ref >60)
GFR calc non Af Amer: >60 mL/min (ref >60)
Glucose, Bld: 118 mg/dL — ABNORMAL HIGH (ref 70–99)
Potassium: 3.5 mmol/L (ref 3.5–5.1)
Sodium: 138 mmol/L (ref 135–145)

## 2019-11-04 LAB — HEPATIC FUNCTION PANEL
ALT: 31 U/L (ref 0–44)
AST: 29 U/L (ref 15–41)
Albumin: 4.1 g/dL (ref 3.5–5.0)
Alkaline Phosphatase: 49 U/L (ref 38–126)
Bilirubin, Direct: 0.1 mg/dL (ref 0.0–0.2)
Indirect Bilirubin: 0.6 mg/dL (ref 0.3–0.9)
Total Bilirubin: 0.7 mg/dL (ref 0.3–1.2)
Total Protein: 7.3 g/dL (ref 6.5–8.1)

## 2019-11-04 LAB — URINALYSIS, COMPLETE (UACMP) WITH MICROSCOPIC
Bacteria, UA: NONE SEEN
Bilirubin Urine: NEGATIVE
Glucose, UA: NEGATIVE mg/dL
Ketones, ur: NEGATIVE mg/dL
Nitrite: NEGATIVE
Protein, ur: 100 mg/dL — AB
Specific Gravity, Urine: 1.018 (ref 1.005–1.030)
WBC, UA: >50 WBC/hpf — ABNORMAL HIGH (ref 0–5)
pH: 6 (ref 5.0–8.0)

## 2019-11-04 LAB — CBC
HCT: 40.4 % (ref 36.0–46.0)
Hemoglobin: 14.3 g/dL (ref 12.0–15.0)
MCH: 29.3 pg (ref 26.0–34.0)
MCHC: 35.4 g/dL (ref 30.0–36.0)
MCV: 82.8 fL (ref 80.0–100.0)
Platelets: 236 10*3/uL (ref 150–400)
RBC: 4.88 MIL/uL (ref 3.87–5.11)
RDW: 12.3 % (ref 11.5–15.5)
WBC: 10.7 10*3/uL — ABNORMAL HIGH (ref 4.0–10.5)
nRBC: 0 % (ref 0.0–0.2)

## 2019-11-04 LAB — POCT PREGNANCY, URINE: Preg Test, Ur: NEGATIVE

## 2019-11-04 MED ORDER — SODIUM CHLORIDE 0.9 % IV SOLN
1.0000 g | Freq: Once | INTRAVENOUS | Status: AC
  Administered 2019-11-04: 1 g via INTRAVENOUS
  Filled 2019-11-04: qty 10

## 2019-11-04 MED ORDER — EUCERIN EX CREA: TOPICAL_CREAM | CUTANEOUS | 0 refills | Status: DC | PRN

## 2019-11-04 MED ORDER — AMOXICILLIN-POT CLAVULANATE 875-125 MG PO TABS: 1.0000 | ORAL_TABLET | Freq: Two times a day (BID) | ORAL | 0 refills | Status: AC

## 2019-11-04 MED ORDER — FLUCONAZOLE 100 MG PO TABS: 200.0000 mg | ORAL_TABLET | ORAL | 0 refills | Status: AC

## 2019-11-04 NOTE — ED Triage Notes (Signed)
Per interpreter pt with pain to her right flank and across her abd since yesterday. Pt reports some dysuria, denies NV

## 2019-11-04 NOTE — ED Provider Notes (Signed)
[facility] Emergency Department Provider Note   ____________________________________________   First MD Initiated Contact with Patient 11/04/19 Erin Valentine     (approximate)  I have reviewed the triage vital signs and the nursing notes.   HISTORY  Chief Complaint Flank Pain, Abdominal Pain, and Dysuria   HPI Erin Valentine is a 24 y.o. female patient reports several years for years of dry flaky skin that itches and ulcerates. Gets worse when she washes dishes. Better if she keeps her hands dry. She also has right flank pain that started yesterday radiates from the right flank down around into the suprapubic area. There is some dysuria and urgency. No fever no vomiting. Pain is achy. Does not appear to be severe.        Past Medical History:  Diagnosis Date  . Kidney infection     Patient Active Problem List   Diagnosis Date Noted  . Preterm labor in third trimester with preterm delivery 10/01/2017  . Hx of self-harm 04/02/2017  . 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 Substance induced mood disorder (HCC) 12/25/2016  . Alcohol abuse 12/25/2016    History reviewed. No pertinent surgical history.  Prior to Admission medications   Medication Sig Start Date End Date Taking? Authorizing Provider  amoxicillin-clavulanate (AUGMENTIN) 875-125 MG tablet Take 1 tablet by mouth 2 (two) times daily. 05/14/18   Triplett, Rulon Eisenmenger B, FNP  amoxicillin-clavulanate (AUGMENTIN) 875-125 MG tablet Take 1 tablet by mouth 2 (two) times daily for 10 days. 11/04/19 11/14/19  Arnaldo Natal, MD  cephALEXin (KEFLEX) 500 MG capsule Take 1 capsule (500 mg total) by mouth 3 (three) times daily. 04/09/19   Irean Hong, MD  ibuprofen (ADVIL,MOTRIN) 600 MG tablet Take 1 tablet (600 mg total) by mouth every 6 (six) hours as needed for mild pain, moderate pain or cramping. 10/03/17   Farrel Conners, CNM  naproxen (NAPROSYN) 500 MG tablet  Take 1 tablet (500 mg total) by mouth 2 (two) times daily with a meal. 05/14/18   Triplett, Cari B, FNP  Prenatal Vit-Fe Fumarate-FA (PRENATAL MULTIVITAMIN) TABS tablet Take 1 tablet by mouth daily at 12 noon. 10/03/17   Farrel Conners, CNM  triamcinolone ointment (KENALOG) 0.5 % Apply 1 application topically 2 (two) times daily. 05/14/18   Chinita Pester, FNP    Allergies Patient has no known allergies.  Family History  Problem Relation Age of Onset  . Diabetes Mother   . Hypertension Mother   . Diabetes Maternal Aunt   . Diabetes Maternal Grandfather     Social History Social History   Tobacco Use  . Smoking status: Never Smoker  . Smokeless tobacco: Never Used  Substance Use Topics  . Alcohol use: No  . Drug use: No    Review of Systems  Constitutional: No fever/chills Eyes: No visual changes. ENT: No sore throat. Cardiovascular: Denies chest pain. Respiratory: Denies shortness of breath. Gastrointestinal:  abdominal pain.  No nausea, no vomiting.  No diarrhea.  No constipation. Genitourinary:  dysuria. Musculoskeletal: Negative for back pain. Skin: Negative for rash. Neurological: Negative for headaches, focal weakness  ____________________________________________   PHYSICAL EXAM:  VITAL SIGNS: ED Triage Vitals  Enc Vitals Group     BP 11/04/19 1529 117/81     Pulse Rate 11/04/19 1529 69     Resp 11/04/19 1529 20     Temp 11/04/19 1529 98.8 F (37.1 C)     Temp Source 11/04/19 1529  Oral     SpO2 11/04/19 1529 99 %     Weight 11/04/19 1526 140 lb (63.5 kg)     Height 11/04/19 1526 5\' 4"  (1.626 m)     Head Circumference --      Peak Flow --      Pain Score 11/04/19 1526 8     Pain Loc --      Pain Edu? --      Excl. in GC? --     Constitutional: Alert and oriented. Well appearing and in no acute distress. Eyes: Conjunctivae are normal.  Head: Atraumatic. Nose: No congestion/rhinnorhea. Mouth/Throat: Mucous membranes are moist.  Oropharynx  non-erythematous. Neck: No stridor.  Cardiovascular: Normal rate, regular rhythm. Grossly normal heart sounds.  Good peripheral circulation. Respiratory: Normal respiratory effort.  No retractions. Lungs CTAB. Gastrointestinal: Soft there is some pain on palpation especially suprapubically no distention. No abdominal bruits. Bilateral CVA tenderness. This is worse on the left Musculoskeletal: No lower extremity tenderness nor edema.   Neurologic:  Normal speech and language. No gross focal neurologic deficits are appreciated. No gait instability. Skin:  Skin is warm, dry and intact. No rash noted.   ____________________________________________   LABS (all labs ordered are listed, but only abnormal results are displayed)  Labs Reviewed  URINALYSIS, COMPLETE (UACMP) WITH MICROSCOPIC - Abnormal; Notable for the following components:      Result Value   Color, Urine YELLOW (*)    APPearance HAZY (*)    Hgb urine dipstick SMALL (*)    Protein, ur 100 (*)    Leukocytes,Ua TRACE (*)    WBC, UA >50 (*)    All other components within normal limits  BASIC METABOLIC PANEL - Abnormal; Notable for the following components:   Glucose, Bld 118 (*)    All other components within normal limits  CBC - Abnormal; Notable for the following components:   WBC 10.7 (*)    All other components within normal limits  URINE CULTURE  HEPATIC FUNCTION PANEL  POC URINE PREG, ED  POCT PREGNANCY, URINE   ____________________________________________  EKG   ____________________________________________  RADIOLOGY  ED MD interpretation: CT scan read by radiology reviewed by me shows some primary urethral stranding on the right and shrinkage of the right kidney.  Official radiology report(s): CT Renal Stone Study  Result Date: 11/04/2019 CLINICAL DATA:  Abdominal distension and flank pain. Right-sided flank pain since yesterday. EXAM: CT ABDOMEN AND PELVIS WITHOUT CONTRAST TECHNIQUE: Multidetector CT  imaging of the abdomen and pelvis was performed following the standard protocol without IV contrast. COMPARISON:  June 06, 2015 FINDINGS: Lower chest: The lung bases are clear. The heart size is normal. Hepatobiliary: The liver is normal. Normal gallbladder.There is no biliary ductal dilation. Pancreas: Normal contours without ductal dilatation. No peripancreatic fluid collection. Spleen: Unremarkable. Adrenals/Urinary Tract: --Adrenal glands: Unremarkable. --Right kidney/ureter: The right kidney is lobulated in contour and has significantly decreased in size since the prior study. There are no radiopaque obstructing kidney stones. There is some mild fat stranding about the right ureter. --Left kidney/ureter: No hydronephrosis or radiopaque kidney stones. --Urinary bladder: Unremarkable. Stomach/Bowel: --Stomach/Duodenum: No hiatal hernia or other gastric abnormality. Normal duodenal course and caliber. --Small bowel: Unremarkable. --Colon: Unremarkable. --Appendix: Normal. Vascular/Lymphatic: Normal course and caliber of the major abdominal vessels. --No retroperitoneal lymphadenopathy. --No mesenteric lymphadenopathy. --No pelvic or inguinal lymphadenopathy. Reproductive: Unremarkable Other: No ascites or free air. The abdominal wall is normal. Musculoskeletal. No acute displaced fractures. IMPRESSION: 1. Mild fat  stranding about the right ureter may be seen in the setting of a recently passed stone or ascending urinary tract infection. Correlation with urinalysis is recommended. 2. Significant interval decrease in size of the right kidney with increasing lobulation and cortical thinning. Findings are concerning for sequela of recurrent right-sided pyelonephritis. Consider outpatient urology follow-up. 3. No radiopaque obstructing kidney stones. 4. Normal appendix. Electronically Signed   By: Katherine Mantle M.D.   On: 11/04/2019 19:29     ____________________________________________   PROCEDURES  Procedure(s) performed (including Critical Care):  Procedures   ____________________________________________   INITIAL IMPRESSION / ASSESSMENT AND PLAN / ED COURSE  Patient was apparently recurrent pyelonephritis.  We will treat with Augmentin and give Rocephin.  We will have him follow-up with urology as suggested by radiology.  Patient is already having some volume loss of the kidney.  We need to see if there is a treatable etiology of her recurrent pyelonephritis.              ____________________________________________   FINAL CLINICAL IMPRESSION(S) / ED DIAGNOSES  Final diagnoses:  Flank pain  Right flank pain  Urinary tract infection with hematuria, site unspecified     ED Discharge Orders         Ordered    amoxicillin-clavulanate (AUGMENTIN) 875-125 MG tablet  2 times daily     Discontinue  Reprint     11/04/19 1953           Note:  This document was prepared using Dragon voice recognition software and may include unintentional dictation errors.    Arnaldo Natal, MD 11/04/19 Corky Crafts

## 2019-11-04 NOTE — ED Notes (Signed)
Lab contacted to confirm adding on hepatic function panel

## 2019-11-04 NOTE — ED Triage Notes (Signed)
Pt reports right flank pain and abd pain that started yesterday. Pt not able to describe the pain.

## 2019-11-04 NOTE — ED Notes (Signed)
See triage note, interpreter used. Pt reports flank pain and pain with urination that started yesterday. Denies N/V.  Pt also c/o dryness and itching on both hands Pt in NAd, RR even and unlabored

## 2019-11-04 NOTE — Discharge Instructions (Signed)
Take the Augmentin antibiotic 1 twice a day.  Return for worse pain fever vomiting or if you not any better in 2 days.  Please follow-up with your regular doctor in 2 to 3 days and have them check on the urine culture.  Please also follow-up with urologist.  Give them a call in the morning and schedule an appointment in the next week or so.  Tome el Augmentin antibiotico Neomia Dear,  vecez al dia.  Regrese si el dolor es severo, el vomito o si no hay mejoria en 2 dias.  Ver a su medico regular en 2 o 3 dias para que revizen de nuevo el cultivo de [place].  Profavor vea al [place].  Llameles temprano y haga una cita para la seguiente semana.

## 2019-11-04 NOTE — ED Triage Notes (Signed)
Pt also has skin peeling intermittently on both hands for the past few years and would like her hands checked out.

## 2019-11-04 NOTE — ED Notes (Signed)
Pt given ice water as requested °

## 2019-11-06 LAB — URINE CULTURE: Culture: NO GROWTH

## 2019-11-12 ENCOUNTER — Ambulatory Visit (LOCAL_COMMUNITY_HEALTH_CENTER): Payer: Medicaid Other | Admitting: Physician Assistant

## 2019-11-12 ENCOUNTER — Encounter: Payer: Self-pay | Admitting: Physician Assistant

## 2019-11-12 ENCOUNTER — Other Ambulatory Visit: Payer: Self-pay

## 2019-11-12 VITALS — BP 123/80 | Ht 63.5 in | Wt 136.2 lb

## 2019-11-12 DIAGNOSIS — B373 Candidiasis of vulva and vagina: Secondary | ICD-10-CM

## 2019-11-12 DIAGNOSIS — Z3009 Encounter for other general counseling and advice on contraception: Secondary | ICD-10-CM

## 2019-11-12 DIAGNOSIS — Z01419 Encounter for gynecological examination (general) (routine) without abnormal findings: Secondary | ICD-10-CM | POA: Diagnosis not present

## 2019-11-12 DIAGNOSIS — Z30013 Encounter for initial prescription of injectable contraceptive: Secondary | ICD-10-CM

## 2019-11-12 DIAGNOSIS — B3731 Acute candidiasis of vulva and vagina: Secondary | ICD-10-CM

## 2019-11-12 LAB — WET PREP FOR TRICH, YEAST, CLUE: Trichomonas Exam: NEGATIVE

## 2019-11-12 LAB — PREGNANCY, URINE: Preg Test, Ur: NEGATIVE

## 2019-11-12 MED ORDER — CLOTRIMAZOLE 1 % VA CREA: 1.0000 | TOPICAL_CREAM | Freq: Every day | VAGINAL | 0 refills | Status: AC

## 2019-11-12 MED ORDER — MEDROXYPROGESTERONE ACETATE 150 MG/ML IM SUSP
150.0000 mg | INTRAMUSCULAR | Status: AC
  Administered 2019-11-12: 150 mg via INTRAMUSCULAR

## 2019-11-12 MED ORDER — MULTI-VITAMIN/MINERALS PO TABS: 1.0000 | ORAL_TABLET | Freq: Every day | ORAL | 0 refills | Status: AC

## 2019-11-12 NOTE — Progress Notes (Signed)
Pt to clinic for vaginal screening for infection, currently taking antibiotic for UTI (was at hospital last week); also interested in birth control, wants IUD but okay with starting with depo.  Pt states all sex in the last 2 weeks was with a condom; no problems with condoms breaking.

## 2019-11-12 NOTE — Progress Notes (Signed)
Family Planning Visit- Repeat Yearly Visit  Subjective:  Erin Valentine is a 24 y.o. G2P1102  being seen today for an well woman visit and to discuss family planning options.    She is currently using condoms most of the time for pregnancy prevention. Patient reports she does not  want a pregnancy in the next year. Patient  has Adjustment disorder with mixed disturbance of emotions and conduct; Substance induced mood disorder (HCC); Alcohol abuse; Preterm labor in third trimester with preterm delivery; and Hx of self-harm on their problem list.  Chief Complaint  Patient presents with  . Contraception    PE, discuss IUD, Depo today.    Patient reports that she is thinking about getting an IUD for [facility] but would like to get a Depo today since she has used this in the past without difficulty.  Reports that she is still taking antibiotic given to her at ER for UTI and that she has an appointment with urology to follow up on 11/26/2019.  Discussed with patient that I reviewed her visit at the ER in her chart and it is very important that she complete her antibiotics as directed and follow up with the Urology doctor for evaluation of her kidneys.  Patient concerned about itching in the vaginal area today.  Patient denies other concerns and changes to personal and family history.     See flowsheet for other program required questions.   Body mass index is 23.75 kg/m. - Patient is eligible for diabetes screening based on BMI and age >52?  not applicable HA1C ordered? not applicable  Patient reports 1 of partners in last year. Desires STI screening?  No - currently taking antibiotics for UTI.   Has patient been screened once for HCV in the past?  No  No results found for: HCVAB  Does the patient have current of drug use, have a partner with drug use, and/or has been incarcerated since last result? No  If yes-- Screen for HCV through [facility]   Does the patient meet criteria for HBV  testing? No  Criteria:  -Household, sexual or needle sharing contact with HBV -History of drug use -HIV positive -Those with known Hep C   Health Maintenance Due  Topic Date Due  . Hepatitis C Screening  Never done  . TETANUS/TDAP  Never done  . CHLAMYDIA SCREENING  03/02/2017  . PAP-Cervical Cytology Screening  Never done  . PAP SMEAR-Modifier  Never done    Review of Systems  All other systems reviewed and are negative.   The following portions of the patient's history were reviewed and updated as appropriate: allergies, current medications, past family history, past medical history, past social history, past surgical history and problem list. Problem list updated.  Objective:   Vitals:   11/12/19 0847  BP: 123/80  Weight: 136 lb 3.2 oz (61.8 kg)  Height: 5' 3.5" (1.613 m)    Physical Exam Vitals and nursing note reviewed.  Constitutional:      General: She is not in acute distress.    Appearance: She is normal weight.  HENT:     Head: Normocephalic and atraumatic.  Eyes:     Conjunctiva/sclera: Conjunctivae normal.  Neck:     Thyroid: No thyroid mass, thyromegaly or thyroid tenderness.  Cardiovascular:     Rate and Rhythm: Normal rate and regular rhythm.  Pulmonary:     Effort: Pulmonary effort is normal.     Breath sounds: Normal breath sounds.  Chest:     Breasts:        Right: Normal. No mass, nipple discharge, skin change or tenderness.        Left: Normal. No mass, nipple discharge, skin change or tenderness.  Abdominal:     Palpations: Abdomen is soft. There is no mass.     Tenderness: There is no abdominal tenderness. There is no guarding or rebound.  Genitourinary:    General: Normal vulva.     Rectum: Normal.     Comments: External genitalia/pubic area without nits, lice, edema, erythema, lesions and inguinal adenopathy. Vagina with normal mucosa and discharge. Cervix without visible lesions. Uterus firm, mobile, nt, no masses, no CMT, no  adnexal tenderness or fullness. Musculoskeletal:     Cervical back: Neck supple. No tenderness.  Lymphadenopathy:     Cervical: No cervical adenopathy.     Upper Body:     Right upper body: No supraclavicular, axillary or pectoral adenopathy.     Left upper body: No supraclavicular, axillary or pectoral adenopathy.  Skin:    General: Skin is warm and dry.     Findings: No bruising, erythema, lesion or rash.     Comments: Multiple tattoos.  Neurological:     Mental Status: She is alert and oriented to person, place, and time.  Psychiatric:        Mood and Affect: Mood normal.        Behavior: Behavior normal.        Thought Content: Thought content normal.        Judgment: Judgment normal.       Assessment and Plan:  Erin Valentine is a 24 y.o. female G2P1102 presenting to the [facility] Department for an yearly well woman exam/family planning visit  Contraception counseling: Reviewed all forms of birth control options in the tiered based approach. available including abstinence; over the counter/barrier methods; hormonal contraceptive medication including pill, patch, ring, injection,contraceptive implant, ECP; hormonal and nonhormonal IUDs; permanent sterilization options including vasectomy and the various tubal sterilization modalities. Risks, benefits, and typical effectiveness rates were reviewed.  Questions were answered.  Written information was also given to the patient to review.  Patient desires Depo today, this was prescribed for patient. She will follow up in  3 months and prn for surveillance.  She was told to call with any further questions, or with any concerns about this method of contraception.  Emphasized use of condoms 100% of the time for STI prevention.  Patient was not a candidate for ECP today.   1. Encounter for counseling regarding contraception Counseled patient re: hormonal vs no hormonal IUD and patient states that she wants to think  about it more. Give written info (in Spanish) to patient on hormonal vs non-hormonal IUD to review. Rec condoms with all sex for 2 weeks after Depo today and enc always. Reviewed with patient when to call clinic for irregular bleeding with Depo.   2. Well female exam with routine gynecological exam Counseled patient on healthy habits for maintaining normal BMI. Enc MVI 1 po daily. Counseled patient on importance of completing antibiotic given at the ER and keeping follow up appointment with specialist for evaluation of kidney function. Await pap results.  Counseled that RN will call with results and follow up plan once results are back.  - WET PREP FOR TRICH, YEAST, CLUE - Pap IG (Image Guided) - Multiple Vitamins-Minerals (MULTIVITAMIN WITH MINERALS) tablet; Take 1 tablet by mouth daily.  Dispense: 100  tablet; Refill: 0  3. Initiation of Depo Provera If pregnancy test is negative, OK for Depo 150mg  IM q 11-13 weeks for 1 year starting today. Counseled to use condoms for 2 weeks and check an OTC pregnancy test in 2 weeks and RTC if positive. - Pregnancy, urine - medroxyPROGESTERone (DEPO-PROVERA) injection 150 mg  4. Candidal vulvovaginitis Treat for yeast due to antibiotic use with Clotrimazole 1% vaginal cream 1 app qhs for 7 days. No sex for 7 days. - clotrimazole (CLOTRIMAZOLE-7) 1 % vaginal cream; Place 1 Applicatorful vaginally at bedtime for 7 days.  Dispense: 45 g; Refill: 0     No follow-ups on file.  Future Appointments  Date Time Provider Department Center  11/25/2019  1:30 PM 11/27/2019, MD [facility] None    Vanna Scotland, Matt Holmes

## 2019-11-12 NOTE — Progress Notes (Signed)
Patient given Depo per provider orders. Also given MVI and wet mount reviewed. Patient treated for yeast per provider orders. Given information booklets about mirena and paraguard. Depo consent signed, and reminder card for next Depo given.Burt Knack, RN

## 2019-11-14 LAB — PAP IG (IMAGE GUIDED): PAP Smear Comment: 0

## 2019-11-25 ENCOUNTER — Encounter: Payer: Self-pay | Admitting: Urology

## 2019-11-25 ENCOUNTER — Ambulatory Visit: Payer: Self-pay | Admitting: Urology

## 2019-12-12 NOTE — Addendum Note (Signed)
Addended by: Sadie Haber on: 12/12/2019 09:32 AM   Modules accepted: Orders

## 2020-01-20 NOTE — Progress Notes (Signed)
01/21/2020 9:48 AM   Erin Valentine [birth year] [id number]  Referring provider: No referring provider defined for this encounter. Chief Complaint  Patient presents with  . Nephrolithiasis    New Patient    HPI: Erin Valentine is a 24 y.o. female who presents today for evaluation and management of recurrent pyelonephritis.  Spanish interpreter, Erin Valentine is present today.  She was first diagnosed with right pyelonephritis in 02/2015 and 05/2015.  This is both based on urine culture as well as radiographic evidence of pyelonephritis.  She had a UTI in 04/2019 and culture grew E. Coli.   She was presented to the ED on 11/04/2019 for dysuria, right flank and abdominal pain. Right flank pain radiated down to suprapubic area. She had some urgency. Pain was achy. No fever or emesis. UA noted small blood, 11-20 RBC, >50 WBC and no bacteria. Urine culture was negative. Patient was treated with Augmentin and given Rochepin.   CT renal stone study from 11/04/19 revealed mild fat stranding about the right ureter may be seen in the setting of a recently passed stone or ascending urinary tract infection. Significant interval decrease in size of the right kidney with increasing lobulation and cortical thinning. Findings are concerning for sequela of recurrent right-sided pyelonephritis. No radiopaque obstructing kidney stones. Normal appendix.  She reports the urge to urinate but can not urinate. She has kidney discomfort. She reports having x 3-4 infections this year but also reports that she is only had them treated at the hospital, all of which two episodes are only documented. Urinary symptoms worsen with each infection. Her last infection was 1 month ago and she was treated at her local hospital.    Patient became sexually active around 2016. She feels infections correlate with sexual activity.   She has no childhood history of infections.   PMH: Past Medical History:  Diagnosis  Date  . Kidney infection     Surgical History: Past Surgical History:  Procedure Laterality Date  . denies      Home Medications:  Allergies as of 01/21/2020   No Known Allergies     Medication List       Accurate as of January 21, 2020  9:48 AM. If you have any questions, ask your nurse or doctor.        STOP taking these medications   amoxicillin-clavulanate 875-125 MG tablet Commonly known as: Augmentin Stopped by: Vanna Scotland, MD   cephALEXin 500 MG capsule Commonly known as: KEFLEX Stopped by: Vanna Scotland, MD   eucerin cream Stopped by: Vanna Scotland, MD   ibuprofen 600 MG tablet Commonly known as: ADVIL Stopped by: Vanna Scotland, MD   naproxen 500 MG tablet Commonly known as: Naprosyn Stopped by: Vanna Scotland, MD   prenatal multivitamin Tabs tablet Stopped by: Vanna Scotland, MD   triamcinolone ointment 0.5 % Commonly known as: KENALOG Stopped by: Vanna Scotland, MD     TAKE these medications   multivitamin with minerals tablet Take 1 tablet by mouth daily.       Allergies: No Known Allergies  Family History: Family History  Problem Relation Age of Onset  . Diabetes Mother   . Hypertension Mother   . Diabetes Maternal Aunt   . Diabetes Maternal Grandfather     Social History:  reports that she has never smoked. She has never used smokeless tobacco. She reports current alcohol use. She reports that she does not use drugs.   Physical Exam: BP 129/75  Pulse 60   Ht 5\' 4"  (1.626 m)   Wt 144 lb (65.3 kg)   BMI 24.72 kg/m   Constitutional:  Alert and oriented, No acute distress. HEENT: [place] AT, moist mucus membranes.  Trachea midline, no masses. Cardiovascular: No clubbing, cyanosis, or edema. Respiratory: Normal respiratory effort, no increased work of breathing. Skin: No rashes, bruises or suspicious lesions. Neurologic: Grossly intact, no focal deficits, moving all 4 extremities. Psychiatric: Normal mood and  affect.  Laboratory Data:  Lab Results  Component Value Date   CREATININE 0.62 11/04/2019    Urinalysis Urinalysis today is unremarkable, see epic  Pertinent Imaging: Results for orders placed during the hospital encounter of 11/04/19  CT Renal Stone Study  Narrative CLINICAL DATA:  Abdominal distension and flank pain. Right-sided flank pain since yesterday.  EXAM: CT ABDOMEN AND PELVIS WITHOUT CONTRAST  TECHNIQUE: Multidetector CT imaging of the abdomen and pelvis was performed following the standard protocol without IV contrast.  COMPARISON:  June 06, 2015  FINDINGS: Lower chest: The lung bases are clear. The heart size is normal.  Hepatobiliary: The liver is normal. Normal gallbladder.There is no biliary ductal dilation.  Pancreas: Normal contours without ductal dilatation. No peripancreatic fluid collection.  Spleen: Unremarkable.  Adrenals/Urinary Tract:  --Adrenal glands: Unremarkable.  --Right kidney/ureter: The right kidney is lobulated in contour and has significantly decreased in size since the prior study. There are no radiopaque obstructing kidney stones. There is some mild fat stranding about the right ureter.  --Left kidney/ureter: No hydronephrosis or radiopaque kidney stones.  --Urinary bladder: Unremarkable.  Stomach/Bowel:  --Stomach/Duodenum: No hiatal hernia or other gastric abnormality. Normal duodenal course and caliber.  --Small bowel: Unremarkable.  --Colon: Unremarkable.  --Appendix: Normal.  Vascular/Lymphatic: Normal course and caliber of the major abdominal vessels.  --No retroperitoneal lymphadenopathy.  --No mesenteric lymphadenopathy.  --No pelvic or inguinal lymphadenopathy.  Reproductive: Unremarkable  Other: No ascites or free air. The abdominal wall is normal.  Musculoskeletal. No acute displaced fractures.  IMPRESSION: 1. Mild fat stranding about the right ureter may be seen in the setting of a  recently passed stone or ascending urinary tract infection. Correlation with urinalysis is recommended. 2. Significant interval decrease in size of the right kidney with increasing lobulation and cortical thinning. Findings are concerning for sequela of recurrent right-sided pyelonephritis. Consider outpatient urology follow-up. 3. No radiopaque obstructing kidney stones. 4. Normal appendix.   Electronically Signed By: June 08, 2015 M.D. On: 11/04/2019 19:29  I have personally reviewed the images and agree with radiologist interpretation.  In comparison with previous scan in 2016 and 17, she has in fact last significant right sided cortical mass with evidence of scarring.   Assessment & Plan:    1. Recurrent UTI/pyelonephritis  Single episode of simple cystitis as well as an additional episode of pyelonephritis this year.  She is remote history of this several years ago and seems to be correlated with sexual activity.  Counseled pt on prevention techniques including probiotics, cranberry tablets and d'mannose.  We also had a lengthy discussion today about hygiene.  Avoidance of douching as well as sexual hygiene discussed today at length.  At this point time, given that her episodes are fairly infrequent, will hold off on suppressive antibiotics.  May consider prophylactic colloidal antibiotics versus suppressive daily antibiotics if she continues to have frequent infections in an effort to prevent further episodes of pyelonephritis and additional parenchymal loss.  If she does have another episode of UTI this year, will move  forward with this plan.  We discussed that if she has signs or symptoms of infection, she should present present immediately to our clinic for same day or next day visit for treatment.  I'd like to keep close track of her.   2. Right renal atrophy  Scarring/ atrophy of right kidney consistent with known previous infection.  Infections are fairly  infrequent.  Very low concern for congenital issues such as reflux given that infections only started with onset of sexual activity and not childhood.     [facility] [address], [address] [place], [place] [address] [phone]  I, Theador Hawthorne, am acting as a scribe for Dr. Vanna Scotland.  I have reviewed the above documentation for accuracy and completeness, and I agree with the above.   Vanna Scotland, MD  I spent 45 total minutes on the day of the encounter including pre-visit review of the medical record, face-to-face time with the patient, and post visit ordering of labs/imaging/tests.

## 2020-01-21 ENCOUNTER — Ambulatory Visit (INDEPENDENT_AMBULATORY_CARE_PROVIDER_SITE_OTHER): Payer: Medicaid Other | Admitting: Urology

## 2020-01-21 ENCOUNTER — Encounter: Payer: Self-pay | Admitting: Urology

## 2020-01-21 ENCOUNTER — Other Ambulatory Visit: Payer: Self-pay

## 2020-01-21 VITALS — BP 129/75 | HR 60 | Ht 64.0 in | Wt 144.0 lb

## 2020-01-21 DIAGNOSIS — N200 Calculus of kidney: Secondary | ICD-10-CM

## 2020-01-21 NOTE — Patient Instructions (Signed)
Vaginitis Vaginitis  La vaginitis es la irritacin e hinchazn (inflamacin) de la vagina. Ocurre cuando las bacterias y levaduras que se encuentran normalmente en la vagina crecen demasiado. Hay muchos tipos de esta afeccin. El tratamiento depende del tipo que usted tenga. Siga estas indicaciones en su casa: Estilo de vida  Mantenga el rea vaginal limpia y seca. ? Evite usar jabn. ? Enjuague la zona con agua.  No haga las siguientes cosas hasta que el mdico lo autorice: ? Lavar y limpiar dentro de la vagina (ducha vaginal). ? Usar tampones. ? Tener relaciones sexuales.  Cuando vaya al bao, lmpiese de adelante hacia atrs.  Deje que la vagina respire. ? Use ropa interior de algodn. ? No use:  Ropa interior mientras duerme.  Pantalones ajustados.  Ropa interior tipo tanga.  Ropa interior o medias de nailon sin proteccin de algodn. ? Qutese la ropa hmeda, como los trajes de bao, lo antes posible.  Use productos suaves y sin perfume. No use cosas que puedan irritar la vagina, como los suavizantes para telas. Evite los siguientes productos si son perfumados: ? Aerosoles ntimos femeninos. ? Detergentes. ? Tampones. ? Productos de higiene femenina. ? Jabones o baos de espuma.  Practique sexo seguro y use preservativos. Instrucciones generales  Tome los medicamentos de venta libre y los recetados solamente como se lo haya indicado el mdico.  Si le recetaron un antibitico, tmelo o selo como se lo haya indicado el mdico. No deje de tomar ni de usar los antibiticos aunque comience a sentirse mejor.  Concurra a todas las visitas de control como se lo haya indicado el mdico. Esto es importante. Comunquese con un mdico si:  Tiene dolor de vientre.  Tiene fiebre.  Sus sntomas duran ms de 2 o 3das. Solicite ayuda de inmediato si:  Tiene fiebre y los sntomas empeoran de manera sbita. Resumen  La vaginitis es la irritacin e hinchazn de la  vagina. Puede ocurrir cuando las bacterias y levaduras que se encuentran normalmente en la vagina crecen demasiado. Hay varios tipos.  El tratamiento depende del tipo que usted tenga.  No se haga duchas vaginales, no use tampones ni tenga relaciones sexuales hasta que el mdico la autorice. Cuando pueda volver a tener relaciones sexuales, practique sexo seguro y use condones. Esta informacin no tiene como fin reemplazar el consejo del mdico. Asegrese de hacerle al mdico cualquier pregunta que tenga. Document Revised: 01/18/2017 Document Reviewed: 10/05/2011 Elsevier Patient Education  2020 Elsevier Inc.  

## 2020-01-22 LAB — URINALYSIS, COMPLETE
Bilirubin, UA: NEGATIVE
Glucose, UA: NEGATIVE
Ketones, UA: NEGATIVE
Leukocytes,UA: NEGATIVE
Nitrite, UA: NEGATIVE
Protein,UA: NEGATIVE
Specific Gravity, UA: >1.03 — ABNORMAL HIGH (ref 1.005–1.030)
Urobilinogen, Ur: 0.2 mg/dL (ref 0.2–1.0)
pH, UA: 6 (ref 5.0–7.5)

## 2020-01-22 LAB — MICROSCOPIC EXAMINATION

## 2021-06-01 ENCOUNTER — Other Ambulatory Visit: Payer: Self-pay | Admitting: Internal Medicine

## 2021-06-02 LAB — COMPLETE METABOLIC PANEL WITH GFR
AG Ratio: 1.7 (calc) (ref 1.0–2.5)
ALT: 21 U/L (ref 6–29)
AST: 19 U/L (ref 10–30)
Albumin: 4.7 g/dL (ref 3.6–5.1)
Alkaline phosphatase (APISO): 45 U/L (ref 31–125)
BUN: 14 mg/dL (ref 7–25)
CO2: 30 mmol/L (ref 20–32)
Calcium: 9.7 mg/dL (ref 8.6–10.2)
Chloride: 103 mmol/L (ref 98–110)
Creat: 0.78 mg/dL (ref 0.50–0.96)
Globulin: 2.8 g/dL (calc) (ref 1.9–3.7)
Glucose, Bld: 89 mg/dL (ref 65–99)
Potassium: 4.1 mmol/L (ref 3.5–5.3)
Sodium: 139 mmol/L (ref 135–146)
Total Bilirubin: 0.5 mg/dL (ref 0.2–1.2)
Total Protein: 7.5 g/dL (ref 6.1–8.1)
eGFR: 109 mL/min/{1.73_m2} (ref >=60)

## 2021-06-02 LAB — CBC
HCT: 40.6 % (ref 35.0–45.0)
Hemoglobin: 13.9 g/dL (ref 11.7–15.5)
MCH: 30.5 pg (ref 27.0–33.0)
MCHC: 34.2 g/dL (ref 32.0–36.0)
MCV: 89 fL (ref 80.0–100.0)
MPV: 11.6 fL (ref 7.5–12.5)
Platelets: 252 10*3/uL (ref 140–400)
RBC: 4.56 10*6/uL (ref 3.80–5.10)
RDW: 12.6 % (ref 11.0–15.0)
WBC: 7.1 10*3/uL (ref 3.8–10.8)

## 2021-06-02 LAB — LIPID PANEL
Cholesterol: 207 mg/dL — ABNORMAL HIGH (ref <200)
HDL: 51 mg/dL (ref >=50)
LDL Cholesterol (Calc): 126 mg/dL (calc) — ABNORMAL HIGH
Non-HDL Cholesterol (Calc): 156 mg/dL (calc) — ABNORMAL HIGH (ref <130)
Total CHOL/HDL Ratio: 4.1 (calc) (ref <5.0)
Triglycerides: 182 mg/dL — ABNORMAL HIGH (ref <150)

## 2021-06-02 LAB — VITAMIN D 25 HYDROXY (VIT D DEFICIENCY, FRACTURES): Vit D, 25-Hydroxy: 20 ng/mL — ABNORMAL LOW (ref 30–100)

## 2021-08-31 ENCOUNTER — Emergency Department: Payer: Medicaid Other

## 2021-08-31 ENCOUNTER — Emergency Department
Admission: EM | Admit: 2021-08-31 | Discharge: 2021-08-31 | Disposition: A | Discharge status: Home / Self Care | Payer: Medicaid Other | Attending: Emergency Medicine | Admitting: Emergency Medicine

## 2021-08-31 ENCOUNTER — Other Ambulatory Visit: Payer: Self-pay

## 2021-08-31 ENCOUNTER — Encounter: Payer: Self-pay | Admitting: Emergency Medicine

## 2021-08-31 DIAGNOSIS — N12 Tubulo-interstitial nephritis, not specified as acute or chronic: Secondary | ICD-10-CM | POA: Insufficient documentation

## 2021-08-31 DIAGNOSIS — N898 Other specified noninflammatory disorders of vagina: Secondary | ICD-10-CM | POA: Diagnosis not present

## 2021-08-31 DIAGNOSIS — R109 Unspecified abdominal pain: Secondary | ICD-10-CM | POA: Insufficient documentation

## 2021-08-31 DIAGNOSIS — M545 Low back pain, unspecified: Secondary | ICD-10-CM | POA: Diagnosis present

## 2021-08-31 LAB — URINALYSIS, ROUTINE W REFLEX MICROSCOPIC
Bacteria, UA: NONE SEEN
Bilirubin Urine: NEGATIVE
Glucose, UA: NEGATIVE mg/dL
Ketones, ur: 5 mg/dL — AB
Leukocytes,Ua: NEGATIVE
Nitrite: NEGATIVE
Protein, ur: NEGATIVE mg/dL
Specific Gravity, Urine: 1.027 (ref 1.005–1.030)
pH: 5 (ref 5.0–8.0)

## 2021-08-31 LAB — WET PREP, GENITAL
Clue Cells Wet Prep HPF POC: NONE SEEN
Sperm: NONE SEEN
Trich, Wet Prep: NONE SEEN
WBC, Wet Prep HPF POC: <10 (ref <10)
Yeast Wet Prep HPF POC: NONE SEEN

## 2021-08-31 LAB — CHLAMYDIA/NGC RT PCR (ARMC ONLY)
Chlamydia Tr: NOT DETECTED
N gonorrhoeae: NOT DETECTED

## 2021-08-31 LAB — POC URINE PREG, ED: Preg Test, Ur: NEGATIVE

## 2021-08-31 MED ORDER — CEFDINIR 300 MG PO CAPS: 300.0000 mg | ORAL_CAPSULE | Freq: Two times a day (BID) | ORAL | 0 refills | Status: AC

## 2021-08-31 MED ORDER — CEFTRIAXONE SODIUM 1 G IJ SOLR
1.0000 g | Freq: Once | INTRAMUSCULAR | Status: AC
  Administered 2021-08-31: 1 g via INTRAMUSCULAR
  Filled 2021-08-31: qty 10

## 2021-08-31 MED ORDER — LIDOCAINE HCL (PF) 1 % IJ SOLN
INTRAMUSCULAR | Status: AC
  Filled 2021-08-31: qty 5

## 2021-08-31 NOTE — ED Triage Notes (Signed)
Pt via POV from home. Pt c/o dysuria, back pain, vomiting, and fever that started yesterday. States yesterday she having some yellowish vaginal discharge. Pt took 0730 Tylenol this AM. Denies actually taking her temperature. Pt also endorses a sore throat. Pt is A&OX4 and NAD ?

## 2021-08-31 NOTE — ED Provider Notes (Signed)
? ?[facility] ?Provider Note ? ?Patient Contact: 4:07 PM (approximate) ? ? ?History  ? ?Fever, Emesis, and Back Pain ? ? ?HPI ? ?Erin Valentine is a 26 y.o. female presents to the emergency department with dysuria, increased vaginal discharge, low back pain and vomiting that started last night.  Patient denies chest pain, chest tightness or shortness of breath.  No current abdominal pain. ? ?  ? ? ?Physical Exam  ? ?Triage Vital Signs: ?ED Triage Vitals  ?Enc Vitals Group  ?   BP 08/31/21 1450 132/70  ?   Pulse Rate 08/31/21 1450 92  ?   Resp 08/31/21 1450 17  ?   Temp 08/31/21 1450 98.7 ?F (37.1 ?C)  ?   Temp Source 08/31/21 1450 Oral  ?   SpO2 08/31/21 1450 98 %  ?   Weight 08/31/21 1454 160 lb (72.6 kg)  ?   Height 08/31/21 1454 5\' 4"  (1.626 m)  ?   Head Circumference --   ?   Peak Flow --   ?   Pain Score 08/31/21 1454 8  ?   Pain Loc --   ?   Pain Edu? --   ?   Excl. in GC? --   ? ? ?Most recent vital signs: ?Vitals:  ? 08/31/21 1450  ?BP: 132/70  ?Pulse: 92  ?Resp: 17  ?Temp: 98.7 ?F (37.1 ?C)  ?SpO2: 98%  ? ? ? ?General: Alert and in no acute distress. ?Eyes:  PERRL. EOMI. ?Head: No acute traumatic findings ?ENT: ?     Ears: Tms are pearly. ?     Nose: No congestion/rhinnorhea. ?     Mouth/Throat: Mucous membranes are moist.  ?Neck: No stridor. No cervical spine tenderness to palpation. ?Cardiovascular:  Good peripheral perfusion ?Respiratory: Normal respiratory effort without tachypnea or retractions. Lungs CTAB. Good air entry to the bases with no decreased or absent breath sounds. ?Gastrointestinal: Bowel sounds ?4 quadrants. Soft and nontender to palpation. No guarding or rigidity. No palpable masses. No distention. No CVA tenderness. ?Musculoskeletal: Full range of motion to all extremities.  ?Neurologic:  No gross focal neurologic deficits are appreciated.  ?Skin:   No rash noted ?Other: ? ? ?ED Results / Procedures / Treatments  ? ?Labs ?(all labs ordered are listed, but  only abnormal results are displayed) ?Labs Reviewed  ?URINALYSIS, ROUTINE W REFLEX MICROSCOPIC - Abnormal; Notable for the following components:  ?    Result Value  ? Color, Urine YELLOW (*)   ? APPearance HAZY (*)   ? Hgb urine dipstick SMALL (*)   ? Ketones, ur 5 (*)   ? All other components within normal limits  ?WET PREP, GENITAL  ?CHLAMYDIA/NGC RT PCR ([facility] ONLY)            ?URINE CULTURE  ?URINE CULTURE  ?POC URINE PREG, ED  ? ? ? ?PROCEDURES: ? ?Critical Care performed: No ? ?Procedures ? ? ?MEDICATIONS ORDERED IN ED: ?Medications  ?lidocaine (PF) (XYLOCAINE) 1 % injection (has no administration in time range)  ?cefTRIAXone (ROCEPHIN) injection 1 g (1 g Intramuscular Given 08/31/21 1713)  ? ? ? ?IMPRESSION / MDM / ASSESSMENT AND PLAN / ED COURSE  ?I reviewed the triage vital signs and the nursing notes. ?             ?               ?Assessment and plan ?Flank pain,  ?Dysuria ? ?Differential diagnosis includes, but  is not limited to, UTI, pyelonephritis, PID, nephrolithiasis ? ?26 year old female presents to the emergency department with nausea, vomiting, flank pain and increased vaginal discharge. ?Urinalysis shows small amount of blood but no other findings suggestive of UTI.  CT renal stone study noncontributory for nephrolithiasis or appendicitis.  No acute abnormalities in the abdomen or pelvis on this dedicated CT.  Patient does have a history of pyelonephritis.  We will treat patient empirically with Rocephin and Omnicef.  Urine culture is pending. ? ? ?FINAL CLINICAL IMPRESSION(S) / ED DIAGNOSES  ? ?Final diagnoses:  ?Pyelonephritis  ? ? ? ?Rx / DC Orders  ? ?ED Discharge Orders   ? ?      Ordered  ?  cefdinir (OMNICEF) 300 MG capsule  2 times daily       ? 08/31/21 1719  ? ?  ?  ? ?  ? ? ? ?Note:  This document was prepared using Dragon voice recognition software and may include unintentional dictation errors. ?  ?Orvil Feil, PA-C ?08/31/21 1734 ? ?  ?Minna Antis, MD ?08/31/21 2124 ? ?

## 2021-08-31 NOTE — Discharge Instructions (Addendum)
Take Omnicef twice daily for seven days.  °

## 2021-09-01 LAB — URINE CULTURE: Culture: NO GROWTH

## 2021-10-26 ENCOUNTER — Encounter: Payer: Self-pay | Admitting: Nurse Practitioner

## 2021-10-26 ENCOUNTER — Ambulatory Visit (LOCAL_COMMUNITY_HEALTH_CENTER): Payer: Medicaid Other | Admitting: Nurse Practitioner

## 2021-10-26 VITALS — BP 114/76 | Ht 63.5 in | Wt 162.6 lb

## 2021-10-26 DIAGNOSIS — Z01419 Encounter for gynecological examination (general) (routine) without abnormal findings: Secondary | ICD-10-CM

## 2021-10-26 DIAGNOSIS — Z3009 Encounter for other general counseling and advice on contraception: Secondary | ICD-10-CM

## 2021-10-26 DIAGNOSIS — Z113 Encounter for screening for infections with a predominantly sexual mode of transmission: Secondary | ICD-10-CM

## 2021-10-26 DIAGNOSIS — Z3042 Encounter for surveillance of injectable contraceptive: Secondary | ICD-10-CM

## 2021-10-26 LAB — WET PREP FOR TRICH, YEAST, CLUE
Trichomonas Exam: NEGATIVE
Yeast Exam: NEGATIVE

## 2021-10-26 MED ORDER — MEDROXYPROGESTERONE ACETATE 150 MG/ML IM SUSP
150.0000 mg | INTRAMUSCULAR | Status: AC
  Administered 2021-10-26: 150 mg via INTRAMUSCULAR

## 2021-10-26 NOTE — Progress Notes (Addendum)
[facility] DEPARTMENT [facility] [address] Main Number: [phone]    Family Planning Visit- Initial Visit  Subjective:  Cole Klugh is a 26 y.o.  [id number]   being seen today for an initial annual visit and to discuss reproductive life planning.  The patient is currently using Female Condom for pregnancy prevention. Patient reports   does not want a pregnancy in the next year.     report they are looking for a method that provides High efficacy at preventing pregnancy  Patient has the following medical conditions has Adjustment disorder with mixed disturbance of emotions and conduct; Substance induced mood disorder (HCC); Alcohol abuse; Preterm labor in third trimester with preterm delivery; and Hx of self-harm on their problem list.  Chief Complaint  Patient presents with   Annual Exam    Patient reports to clinic today for a physical, birth control, and STD screening.    Body mass index is 28.35 kg/m. - Patient is eligible for diabetes screening based on BMI and age >74?  not applicable HA1C ordered? not applicable  Patient reports 1  partner/s in last year. Desires STI screening?  Yes  Has patient been screened once for HCV in the past?  No  No results found for: "HCVAB"  Does the patient have current drug use (including MJ), have a partner with drug use, and/or has been incarcerated since last result? No  If yes-- Screen for HCV through [facility]   Does the patient meet criteria for HBV testing? No  Criteria:  -Household, sexual or needle sharing contact with HBV -History of drug use -HIV positive -Those with known Hep C   Health Maintenance Due  Topic Date Due   COVID-19 Vaccine (1) Never done   HPV VACCINES (3 - 3-dose series) 11/27/2014   Hepatitis C Screening  Never done    Review of Systems  Constitutional:  Negative for chills, fever, malaise/fatigue and weight loss.  HENT:  Negative for congestion,  hearing loss and sore throat.   Eyes:  Negative for blurred vision, double vision and photophobia.  Respiratory:  Negative for shortness of breath.   Cardiovascular:  Negative for chest pain.  Gastrointestinal:  Negative for abdominal pain, blood in stool, constipation, diarrhea, heartburn, nausea and vomiting.  Genitourinary:  Positive for frequency. Negative for dysuria.       Discharge   Musculoskeletal:  Negative for back pain, joint pain and neck pain.  Skin:  Negative for itching and rash.  Neurological:  Negative for dizziness, weakness and headaches.  Endo/Heme/Allergies:  Does not bruise/bleed easily.  Psychiatric/Behavioral:  Negative for depression, substance abuse and suicidal ideas.     The following portions of the patient's history were reviewed and updated as appropriate: allergies, current medications, past family history, past medical history, past social history, past surgical history and problem list. Problem list updated.   See flowsheet for other program required questions.  Objective:   Vitals:   10/26/21 1357  BP: 114/76  Weight: 162 lb 9.6 oz (73.8 kg)  Height: 5' 3.5" (1.613 m)    Physical Exam Constitutional:      Appearance: Normal appearance.  HENT:     Head: Normocephalic. No abrasion, masses or laceration. Hair is normal.     Jaw: No tenderness or swelling.     Right Ear: External ear normal.     Left Ear: External ear normal.     Nose: Nose normal.  Mouth/Throat:     Lips: Pink. No lesions.     Mouth: Mucous membranes are moist. No lacerations or oral lesions.     Dentition: No dental caries.     Tongue: No lesions.     Palate: No mass and lesions.     Pharynx: No pharyngeal swelling, oropharyngeal exudate, posterior oropharyngeal erythema or uvula swelling.     Tonsils: No tonsillar exudate or tonsillar abscesses.  Eyes:     Pupils: Pupils are equal, round, and reactive to light.  Neck:     Thyroid: No thyroid mass, thyromegaly or  thyroid tenderness.  Cardiovascular:     Rate and Rhythm: Normal rate and regular rhythm.  Pulmonary:     Effort: Pulmonary effort is normal.     Breath sounds: Normal breath sounds.  Abdominal:     General: Abdomen is flat. Bowel sounds are normal.     Palpations: Abdomen is soft.     Tenderness: There is no abdominal tenderness. There is no rebound.  Genitourinary:    Pubic Area: No rash or pubic lice.      Labia:        Right: No rash, tenderness or lesion.        Left: No rash, tenderness or lesion.      Vagina: Normal. No vaginal discharge, erythema, tenderness or lesions.     Cervix: No cervical motion tenderness, discharge, lesion or erythema.     Uterus: Normal.      Adnexa:        Right: Tenderness present.        Left: Tenderness present.      Rectum: Normal.     Comments: Amount Discharge: small  Odor: No pH: less than 4.5 Adheres to vaginal wall: No Color: color of discharge matches the  swab  Musculoskeletal:     Cervical back: Full passive range of motion without pain and normal range of motion.  Lymphadenopathy:     Cervical: No cervical adenopathy.     Right cervical: No superficial, deep or posterior cervical adenopathy.    Left cervical: No superficial, deep or posterior cervical adenopathy.     Upper Body:     Right upper body: No epitrochlear adenopathy.     Left upper body: No epitrochlear adenopathy.     Lower Body: No right inguinal adenopathy. No left inguinal adenopathy.  Skin:    General: Skin is warm and dry.     Findings: No erythema, laceration, lesion or rash.  Neurological:     Mental Status: She is alert and oriented to person, place, and time.  Psychiatric:        Attention and Perception: Attention normal.        Mood and Affect: Mood normal.        Speech: Speech normal.        Behavior: Behavior normal. Behavior is cooperative.       Assessment and Plan:  Jamie-Lee Galdamez is a 26 y.o. female presenting to the [facility] Department for an initial annual wellness/contraceptive visit  Contraception counseling: Reviewed options based on patient desire and reproductive life plan. Patient is interested in Hormonal Injection. This was provided to the patient today.   Risks, benefits, and typical effectiveness rates were reviewed.  Questions were answered.  Written information was also given to the patient to review.    The patient will follow up in  11 weeks for surveillance.  The patient was told to call  with any further questions, or with any concerns about this method of contraception.  Emphasized use of condoms 100% of the time for STI prevention.  Need for ECP was assessed. ECP not offered due to reports of consistent use of condoms as a form of birth control.    1. Family planning counseling -26 year old female in clinic today for a physical, STD screening, and birth control. -ROS reviewed, patient reports vaginal discharge, irritation after sex, and UTI symptoms.  Patient agrees to STD screening today.  Reviewed with patient proper ways to wipe, encouraged patient to drink at least 6-8 bottle of water daily, and may also use cranberry tablets or juice.  For follow up referred to PCP or and Urgent Care.  Also recommended that patient uses additional lubrication before sexual intercourse.  -Patient interested in using Depo as a birth control method.    2. Well woman exam with routine gynecological exam -Normal well woman exam.   -CBE due 11/2022 -PAP due 11/2022  3. Screening examination for venereal disease -STD screening today. -Patient accepted all screenings including oral GC, vaginal CT/GC, wet prep and declines bloodwork for HIV/RPR.  Patient meets criteria for HepB screening? No. Ordered? No - low risk  Patient meets criteria for HepC screening? No. Ordered? No - low risk   Treat wet prep per standing order Discussed time line for State Lab results and that patient will be called with  positive results and encouraged patient to call if she had not heard in 2 weeks.  Counseled to return or seek care for continued or worsening symptoms Recommended condom use with all sex  Patient is currently using  condoms   to prevent pregnancy.    - Chlamydia/Gonorrhea [place] [facility] - WET PREP FOR TRICH, YEAST, CLUE - Gonococcus culture  4. Surveillance for Depo-Provera contraception -May have Depo 150 MG IM q11-13 weeks x 1 year.    - medroxyPROGESTERone (DEPO-PROVERA) injection 150 mg     Return in about 11 weeks (around 01/11/2022) for Routine DMPA injection.   Glenna Fellows, FNP

## 2021-10-26 NOTE — Progress Notes (Signed)
Patient here for PE, STD check and depo shot. Non-latex condoms given. PCP list given. Wet prep reviewed, no tx per standing orders.

## 2021-10-30 LAB — GONOCOCCUS CULTURE

## 2022-02-07 ENCOUNTER — Emergency Department
Admission: EM | Admit: 2022-02-07 | Discharge: 2022-02-07 | Disposition: A | Discharge status: Home / Self Care | Payer: Medicaid Other | Attending: Emergency Medicine | Admitting: Emergency Medicine

## 2022-02-07 ENCOUNTER — Other Ambulatory Visit: Payer: Self-pay

## 2022-02-07 DIAGNOSIS — N12 Tubulo-interstitial nephritis, not specified as acute or chronic: Secondary | ICD-10-CM | POA: Insufficient documentation

## 2022-02-07 LAB — URINALYSIS, ROUTINE W REFLEX MICROSCOPIC
Bilirubin Urine: NEGATIVE
Glucose, UA: NEGATIVE mg/dL
Hgb urine dipstick: NEGATIVE
Ketones, ur: 5 mg/dL — AB
Nitrite: NEGATIVE
Protein, ur: NEGATIVE mg/dL
Specific Gravity, Urine: 1.019 (ref 1.005–1.030)
pH: 6 (ref 5.0–8.0)

## 2022-02-07 LAB — CBC
HCT: 40.9 % (ref 36.0–46.0)
Hemoglobin: 13.8 g/dL (ref 12.0–15.0)
MCH: 29.2 pg (ref 26.0–34.0)
MCHC: 33.7 g/dL (ref 30.0–36.0)
MCV: 86.5 fL (ref 80.0–100.0)
Platelets: 218 10*3/uL (ref 150–400)
RBC: 4.73 MIL/uL (ref 3.87–5.11)
RDW: 12 % (ref 11.5–15.5)
WBC: 5.7 10*3/uL (ref 4.0–10.5)
nRBC: 0 % (ref 0.0–0.2)

## 2022-02-07 LAB — BASIC METABOLIC PANEL
Anion gap: 8 (ref 5–15)
BUN: 13 mg/dL (ref 6–20)
CO2: 24 mmol/L (ref 22–32)
Calcium: 9.3 mg/dL (ref 8.9–10.3)
Chloride: 107 mmol/L (ref 98–111)
Creatinine, Ser: 0.5 mg/dL (ref 0.44–1.00)
GFR, Estimated: >60 mL/min (ref >60)
Glucose, Bld: 114 mg/dL — ABNORMAL HIGH (ref 70–99)
Potassium: 3.9 mmol/L (ref 3.5–5.1)
Sodium: 139 mmol/L (ref 135–145)

## 2022-02-07 LAB — POC URINE PREG, ED: Preg Test, Ur: NEGATIVE

## 2022-02-07 MED ORDER — LEVOFLOXACIN 750 MG PO TABS: 750.0000 mg | ORAL_TABLET | Freq: Every day | ORAL | 0 refills | Status: AC

## 2022-02-07 MED ORDER — PHENAZOPYRIDINE HCL 100 MG PO TABS: 100.0000 mg | ORAL_TABLET | Freq: Three times a day (TID) | ORAL | 0 refills | Status: AC | PRN

## 2022-02-07 NOTE — ED Triage Notes (Signed)
Pt states history of pyelonephritis a month ago and has been having mid back pain for a month. Pt states she now has generalized body aches as well, denies known fever, vomiting.

## 2022-02-07 NOTE — ED Provider Notes (Signed)
[facility] Provider Note    Event Date/Time   First MD Initiated Contact with Patient 02/07/22 1252     (approximate)   History   Back Pain   HPI  Erin Valentine is a 26 y.o. female with history of pyelonephritis presents to the ER for evaluation of right flank pain that has been present for the past month.  Patient states that she had been treated for urinary tract infection and had some amoxicillin left over.  She tried taking the rest of the prescription without any improvement of her symptoms.  Dysuria has worsened over the past 2 days.  No fever that she is aware of.  No blood noted in her urine.      Physical Exam   Triage Vital Signs: ED Triage Vitals  Enc Vitals Group     BP 02/07/22 1206 138/62     Pulse Rate 02/07/22 1206 75     Resp 02/07/22 1206 16     Temp 02/07/22 1206 98.1 F (36.7 C)     Temp Source 02/07/22 1206 Oral     SpO2 02/07/22 1206 98 %     Weight 02/07/22 1207 150 lb (68 kg)     Height 02/07/22 1207 5\' 5"  (1.651 m)     Head Circumference --      Peak Flow --      Pain Score 02/07/22 1207 8     Pain Loc --      Pain Edu? --      Excl. in [place]? --     Most recent vital signs: Vitals:   02/07/22 1206 02/07/22 1322  BP: 138/62 117/77  Pulse: 75 77  Resp: 16 18  Temp: 98.1 F (36.7 C) 98.2 F (36.8 C)  SpO2: 98% 100%     General: Awake, no distress.  CV:  Good peripheral perfusion.  Resp:  Normal effort.  Abd:  No distention.  Other:  Right CVA tenderness   ED Results / Procedures / Treatments   Labs (all labs ordered are listed, but only abnormal results are displayed) Labs Reviewed  URINALYSIS, ROUTINE W REFLEX MICROSCOPIC - Abnormal; Notable for the following components:      Result Value   Color, Urine YELLOW (*)    APPearance HAZY (*)    Ketones, ur 5 (*)    Leukocytes,Ua TRACE (*)    Bacteria, UA RARE (*)    All other components within normal limits  BASIC METABOLIC PANEL - Abnormal;  Notable for the following components:   Glucose, Bld 114 (*)    All other components within normal limits  CBC  POC URINE PREG, ED     EKG  Not indicated   RADIOLOGY Not indicated   PROCEDURES:  Critical Care performed: No  Procedures   MEDICATIONS ORDERED IN ED: Medications - No data to display   IMPRESSION / MDM / [place] / ED COURSE  I reviewed the triage vital signs and the nursing notes.                              Differential diagnosis includes, but is not limited to, pyelonephritis, acute cystitis, kidney stone, hydronephrosis  Patient's presentation is most consistent with acute illness / injury with system symptoms.  26 year old female presenting to the emergency department for treatment and evaluation of right flank pain and dysuria.  See HPI for further details.  Labs  are reassuring.  Urinalysis does show trace of leukocytes and rare bacteria but also has close epithelial cells.  Plan will be to treat her with Levaquin for suspected pyelonephritis as she does have some pretty significant CVA tenderness and dysuria.  She was encouraged to take the prescription until finished even if she is feeling better.  She was advised to follow-up with her primary care provider for recheck of her urine.  She states that she has an appointment in 1 month and does not think she is going to be able to get up appointment sooner.  She was encouraged to return to the emergency department for symptoms that change or worsen if she is unable to be evaluated sooner.     FINAL CLINICAL IMPRESSION(S) / ED DIAGNOSES   Final diagnoses:  Pyelonephritis     Rx / DC Orders   ED Discharge Orders          Ordered    levofloxacin (LEVAQUIN) 750 MG tablet  Daily        02/07/22 1314    phenazopyridine (PYRIDIUM) 100 MG tablet  3 times daily PRN        02/07/22 1314             Note:  This document was prepared using Dragon voice recognition software and may  include unintentional dictation errors.   Chinita Pester, FNP 02/07/22 1428    Minna Antis, MD 02/07/22 1442

## 2023-01-22 ENCOUNTER — Emergency Department: Payer: Medicaid Other

## 2023-01-22 ENCOUNTER — Other Ambulatory Visit: Payer: Self-pay

## 2023-01-22 ENCOUNTER — Emergency Department
Admission: EM | Admit: 2023-01-22 | Discharge: 2023-01-22 | Disposition: A | Discharge status: Home / Self Care | Payer: Medicaid Other | Attending: Emergency Medicine | Admitting: Emergency Medicine

## 2023-01-22 DIAGNOSIS — S0990XA Unspecified injury of head, initial encounter: Secondary | ICD-10-CM

## 2023-01-22 DIAGNOSIS — Z23 Encounter for immunization: Secondary | ICD-10-CM | POA: Diagnosis not present

## 2023-01-22 DIAGNOSIS — S0101XA Laceration without foreign body of scalp, initial encounter: Secondary | ICD-10-CM | POA: Insufficient documentation

## 2023-01-22 LAB — POC URINE PREG, ED: Preg Test, Ur: NEGATIVE

## 2023-01-22 MED ORDER — OXYCODONE-ACETAMINOPHEN 5-325 MG PO TABS
2.0000 | ORAL_TABLET | Freq: Once | ORAL | Status: AC
  Administered 2023-01-22: 2 via ORAL
  Filled 2023-01-22: qty 2

## 2023-01-22 MED ORDER — IBUPROFEN 800 MG PO TABS: 800.0000 mg | ORAL_TABLET | Freq: Three times a day (TID) | ORAL | 0 refills | Status: AC | PRN

## 2023-01-22 MED ORDER — ONDANSETRON 4 MG PO TBDP
4.0000 mg | ORAL_TABLET | Freq: Once | ORAL | Status: AC
  Administered 2023-01-22: 4 mg via ORAL
  Filled 2023-01-22: qty 1

## 2023-01-22 MED ORDER — TETANUS-DIPHTH-ACELL PERTUSSIS 5-2.5-18.5 LF-MCG/0.5 IM SUSY
0.5000 mL | PREFILLED_SYRINGE | Freq: Once | INTRAMUSCULAR | Status: AC
  Administered 2023-01-22: 0.5 mL via INTRAMUSCULAR
  Filled 2023-01-22: qty 0.5

## 2023-01-22 MED ORDER — OXYCODONE-ACETAMINOPHEN 5-325 MG PO TABS: 2.0000 | ORAL_TABLET | Freq: Three times a day (TID) | ORAL | 0 refills | Status: AC | PRN

## 2023-01-22 MED ORDER — ONDANSETRON 4 MG PO TBDP: 4.0000 mg | ORAL_TABLET | Freq: Four times a day (QID) | ORAL | 0 refills | Status: AC | PRN
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
## 2023-01-22 NOTE — Discharge Instructions (Addendum)

## 2023-02-08 ENCOUNTER — Ambulatory Visit: Payer: Medicaid Other

## 2023-02-14 ENCOUNTER — Telehealth: Payer: Self-pay

## 2023-02-14 NOTE — Telephone Encounter (Signed)
Call to client and 02/16/23 Family Planning appointment rescheduled for 03/02/23. Jossie Ng, RN

## 2023-02-16 ENCOUNTER — Ambulatory Visit: Payer: Medicaid Other

## 2023-02-19 ENCOUNTER — Ambulatory Visit: Payer: Medicaid Other

## 2023-03-02 ENCOUNTER — Ambulatory Visit: Payer: Medicaid Other | Admitting: Family Medicine

## 2023-03-02 VITALS — BP 114/58 | HR 64 | Temp 97.7°F | Wt 154.8 lb

## 2023-03-02 DIAGNOSIS — Z01419 Encounter for gynecological examination (general) (routine) without abnormal findings: Secondary | ICD-10-CM

## 2023-03-02 DIAGNOSIS — Z30013 Encounter for initial prescription of injectable contraceptive: Secondary | ICD-10-CM | POA: Diagnosis not present

## 2023-03-02 DIAGNOSIS — Z3009 Encounter for other general counseling and advice on contraception: Secondary | ICD-10-CM

## 2023-03-02 DIAGNOSIS — B3731 Acute candidiasis of vulva and vagina: Secondary | ICD-10-CM

## 2023-03-02 DIAGNOSIS — Z113 Encounter for screening for infections with a predominantly sexual mode of transmission: Secondary | ICD-10-CM

## 2023-03-02 DIAGNOSIS — Z309 Encounter for contraceptive management, unspecified: Secondary | ICD-10-CM | POA: Diagnosis not present

## 2023-03-02 DIAGNOSIS — Z3202 Encounter for pregnancy test, result negative: Secondary | ICD-10-CM | POA: Diagnosis not present

## 2023-03-02 LAB — WET PREP FOR TRICH, YEAST, CLUE: Trichomonas Exam: NEGATIVE

## 2023-03-02 LAB — HM HIV SCREENING LAB: HM HIV Screening: NEGATIVE

## 2023-03-02 LAB — PREGNANCY, URINE: Preg Test, Ur: NEGATIVE

## 2023-03-02 MED ORDER — CLOTRIMAZOLE 1 % VA CREA: 1.0000 | TOPICAL_CREAM | Freq: Every day | VAGINAL | Status: AC

## 2023-03-02 MED ORDER — MEDROXYPROGESTERONE ACETATE 150 MG/ML IM SUSP
150.0000 mg | INTRAMUSCULAR | Status: AC
  Administered 2023-03-02: 150 mg via INTRAMUSCULAR
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
## 2023-03-02 NOTE — Progress Notes (Signed)
Urine pregnancy results reviewed during visit. The patient was dispensed Clotrimazole Vaginal Cream today. I provided counseling today regarding the medication. We discussed the medication, the side effects and when to call clinic. Patient given the opportunity to ask questions. Questions answered. BTHIELE

## 2023-03-08 LAB — IGP, RFX APTIMA HPV ASCU: PAP Smear Comment: 0
